# Patient Record
Sex: Male | Born: 1990 | Race: White | Hispanic: No | Marital: Single | Smoking: Former smoker
Health system: Southern US, Community
[De-identification: ages and names within clinical notes are randomized; demographics above are authoritative.]

## PROBLEM LIST (undated history)

## (undated) DIAGNOSIS — F1910 Other psychoactive substance abuse, uncomplicated: Secondary | ICD-10-CM

## (undated) HISTORY — PX: SPLENECTOMY, TOTAL: SHX788

---
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
## 2011-07-24 ENCOUNTER — Emergency Department (HOSPITAL_COMMUNITY): Payer: No Typology Code available for payment source

## 2011-07-24 ENCOUNTER — Encounter (HOSPITAL_COMMUNITY): Payer: Self-pay | Admitting: Emergency Medicine

## 2011-07-24 ENCOUNTER — Emergency Department (HOSPITAL_COMMUNITY)
Admission: EM | Admit: 2011-07-24 | Discharge: 2011-07-24 | Disposition: A | Discharge status: Home / Self Care | Payer: No Typology Code available for payment source | Attending: Emergency Medicine | Admitting: Emergency Medicine

## 2011-07-24 DIAGNOSIS — Y92410 Unspecified street and highway as the place of occurrence of the external cause: Secondary | ICD-10-CM | POA: Insufficient documentation

## 2011-07-24 DIAGNOSIS — S46919A Strain of unspecified muscle, fascia and tendon at shoulder and upper arm level, unspecified arm, initial encounter: Secondary | ICD-10-CM

## 2011-07-24 DIAGNOSIS — IMO0002 Reserved for concepts with insufficient information to code with codable children: Secondary | ICD-10-CM | POA: Insufficient documentation

## 2011-07-24 MED ORDER — OXYCODONE-ACETAMINOPHEN 5-325 MG PO TABS
1.0000 | ORAL_TABLET | Freq: Once | ORAL | Status: AC
  Administered 2011-07-24: 1 via ORAL
  Filled 2011-07-24: qty 1

## 2011-07-24 MED ORDER — OXYCODONE-ACETAMINOPHEN 5-325 MG PO TABS: 1.0000 | ORAL_TABLET | ORAL | Status: AC | PRN
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
## 2013-04-27 ENCOUNTER — Telehealth: Payer: Self-pay | Admitting: Physician Assistant

## 2013-04-27 NOTE — Telephone Encounter (Signed)
appt 4/28 with [facility]

## 2013-04-28 ENCOUNTER — Encounter (INDEPENDENT_AMBULATORY_CARE_PROVIDER_SITE_OTHER): Payer: Self-pay

## 2013-04-28 ENCOUNTER — Ambulatory Visit (INDEPENDENT_AMBULATORY_CARE_PROVIDER_SITE_OTHER): Payer: 59 | Admitting: Family Medicine

## 2013-04-28 ENCOUNTER — Encounter: Payer: Self-pay | Admitting: Family Medicine

## 2013-04-28 VITALS — BP 118/65 | HR 87 | Temp 99.1°F | Ht 71.0 in | Wt 187.2 lb

## 2013-04-28 DIAGNOSIS — L700 Acne vulgaris: Secondary | ICD-10-CM

## 2013-04-28 DIAGNOSIS — L708 Other acne: Secondary | ICD-10-CM

## 2013-04-28 MED ORDER — CLINDAMYCIN PHOS-BENZOYL PEROX 1-5 % EX GEL: Freq: Two times a day (BID) | CUTANEOUS | Status: DC

## 2013-04-28 MED ORDER — DOXYCYCLINE HYCLATE 100 MG PO TABS: 100.0000 mg | ORAL_TABLET | Freq: Two times a day (BID) | ORAL | Status: DC
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
## 2013-05-07 ENCOUNTER — Telehealth: Payer: Self-pay | Admitting: Family Medicine

## 2013-05-07 DIAGNOSIS — L709 Acne, unspecified: Secondary | ICD-10-CM

## 2013-05-08 NOTE — Telephone Encounter (Signed)
Referral placed.

## 2013-05-11 ENCOUNTER — Telehealth: Payer: Self-pay | Admitting: Family Medicine

## 2013-08-13 ENCOUNTER — Ambulatory Visit (INDEPENDENT_AMBULATORY_CARE_PROVIDER_SITE_OTHER): Payer: 59 | Admitting: Family Medicine

## 2013-08-13 VITALS — BP 142/89 | HR 109 | Temp 97.0°F | Ht 71.0 in | Wt 184.4 lb

## 2013-08-13 DIAGNOSIS — S61409A Unspecified open wound of unspecified hand, initial encounter: Secondary | ICD-10-CM

## 2013-08-13 DIAGNOSIS — Z23 Encounter for immunization: Secondary | ICD-10-CM

## 2013-08-13 DIAGNOSIS — S61411A Laceration without foreign body of right hand, initial encounter: Secondary | ICD-10-CM

## 2013-08-13 MED ORDER — AMOXICILLIN 875 MG PO TABS
875.0000 mg | ORAL_TABLET | Freq: Two times a day (BID) | ORAL | Status: DC
Start: 2013-08-13 — End: 2014-10-11
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
## 2013-08-21 ENCOUNTER — Ambulatory Visit (INDEPENDENT_AMBULATORY_CARE_PROVIDER_SITE_OTHER): Payer: 59 | Admitting: Family

## 2013-08-21 ENCOUNTER — Encounter: Payer: Self-pay | Admitting: Family

## 2013-08-21 ENCOUNTER — Telehealth: Payer: Self-pay | Admitting: *Deleted

## 2013-08-21 VITALS — BP 123/72 | HR 97 | Temp 98.7°F

## 2013-08-21 DIAGNOSIS — Z4802 Encounter for removal of sutures: Secondary | ICD-10-CM

## 2013-08-21 NOTE — Patient Instructions (Signed)

## 2013-08-21 NOTE — Telephone Encounter (Signed)
Please come earlier for appt.

## 2013-08-21 NOTE — Progress Notes (Signed)
   Subjective:    Patient ID: Shane Carr, male    DOB: 08-07-1990, 23 y.o.   MRN: [id number]  Pt presents to office to have 9 stitches removed in right wrist. Suture / Staple Removal The sutures were placed 7 to 10 days ago (Last Thursday). He tried antibiotic ointment use and oral antibiotics since the wound repair. The treatment provided mild relief. His temperature was unmeasured prior to arrival. There has been no drainage from the wound. There is no redness present. There is no swelling present. The pain has improved.       Review of Systems  Constitutional: Negative.   HENT: Negative.   Respiratory: Negative.   Cardiovascular: Negative.   Gastrointestinal: Negative.   Endocrine: Negative.   Genitourinary: Negative.   Musculoskeletal: Negative.   Neurological: Negative.   Hematological: Negative.   Psychiatric/Behavioral: Negative.   All other systems reviewed and are negative.      Objective:   Physical Exam  Vitals reviewed. Constitutional: He is oriented to person, place, and time. He appears well-developed and well-nourished. No distress.  Cardiovascular: Normal rate, regular rhythm, normal heart sounds and intact distal pulses.   No murmur heard. Pulmonary/Chest: Effort normal and breath sounds normal. No respiratory distress. He has no wheezes.  Abdominal: Soft. Bowel sounds are normal. He exhibits no distension. There is no tenderness.  Musculoskeletal: Normal range of motion. He exhibits no edema and no tenderness.  Neurological: He is alert and oriented to person, place, and time. He has normal reflexes. No cranial nerve deficit.  Skin: Skin is warm and dry. No rash noted. No erythema.  9 Stitches in right wrist  Psychiatric: He has a normal mood and affect. His behavior is normal. Judgment and thought content normal.    BP 123/72  Pulse 97  Temp(Src) 98.7 F (37.1 C) (Oral)  9 stitches removed from right wrist with no complication. Site looks great  with no redness or swelling.      Assessment & Plan:  1. Visit for suture removal -Discussed s/s of infection -Finish antibiotics  -Keep clean and dry  Jannifer Rodneyhristy Shermon Bozzi, FNP

## 2014-10-11 ENCOUNTER — Telehealth: Payer: Self-pay | Admitting: Family Medicine

## 2014-10-11 ENCOUNTER — Ambulatory Visit (INDEPENDENT_AMBULATORY_CARE_PROVIDER_SITE_OTHER): Payer: BLUE CROSS/BLUE SHIELD | Admitting: Family Medicine

## 2014-10-11 ENCOUNTER — Encounter: Payer: Self-pay | Admitting: Family Medicine

## 2014-10-11 VITALS — BP 115/78 | HR 96 | Temp 99.1°F | Ht 71.0 in | Wt 180.8 lb

## 2014-10-11 DIAGNOSIS — J0110 Acute frontal sinusitis, unspecified: Secondary | ICD-10-CM | POA: Diagnosis not present

## 2014-10-11 DIAGNOSIS — H6091 Unspecified otitis externa, right ear: Secondary | ICD-10-CM

## 2014-10-11 DIAGNOSIS — H6123 Impacted cerumen, bilateral: Secondary | ICD-10-CM | POA: Diagnosis not present

## 2014-10-11 MED ORDER — FLUTICASONE PROPIONATE 50 MCG/ACT NA SUSP: 2.0000 | Freq: Every day | NASAL | Status: DC

## 2014-10-11 MED ORDER — OFLOXACIN 0.3 % OT SOLN: 5.0000 [drp] | Freq: Every day | OTIC | Status: DC

## 2014-10-11 NOTE — Progress Notes (Signed)
BP 115/78 mmHg  Pulse 96  Temp(Src) 99.1 F (37.3 C) (Oral)  Ht  (1.803 m)  Wt 180 lb 12.8 oz (82.01 kg)  BMI 25.23 kg/m2   Subjective:    Patient ID: Shane Carr, male    DOB: [birth year], 24 y.o.   MRN: [id number]  HPI: Shane Carr is a 24 y.o. male presenting on 10/11/2014 for Ear Pain; Sinusitis; Chest congestion; and Cough   HPI Ear pain Patient has a 2 day history of bilateral ear pain and congestion and nasal congestion and sinus pressure and sore throat and cough. Cough is productive of clear to white sputum. He has had some low-grade temperatures in the 99 range but no true fevers. He denies any chills. He has been using Q-tips and feels like it made his ear is worse. He also smokes about a third pack per day. No sick contacts.  Relevant past medical, surgical, family and social history reviewed and updated as indicated. Interim medical history since our last visit reviewed. Allergies and medications reviewed and updated.  Review of Systems  Constitutional: Negative for fever and chills.  HENT: Positive for congestion, ear pain, postnasal drip, rhinorrhea, sinus pressure and sore throat. Negative for ear discharge and sneezing.   Eyes: Negative for discharge and visual disturbance.  Respiratory: Positive for cough. Negative for chest tightness, shortness of breath and wheezing.   Cardiovascular: Negative for chest pain, palpitations and leg swelling.  Gastrointestinal: Negative for abdominal pain, diarrhea and constipation.  Genitourinary: Negative for difficulty urinating.  Musculoskeletal: Negative for back pain and gait problem.  Skin: Negative for rash.  Neurological: Negative for dizziness, syncope, light-headedness and headaches.  All other systems reviewed and are negative.   Per HPI unless specifically indicated above     Medication List       This list is accurate as of: 10/11/14  2:35 PM.  Always use your most recent med list.                 fluticasone 50 MCG/ACT nasal spray  Commonly known as:  FLONASE  Place 2 sprays into both nostrils daily.           Objective:    BP 115/78 mmHg  Pulse 96  Temp(Src) 99.1 F (37.3 C) (Oral)  Ht  (1.803 m)  Wt 180 lb 12.8 oz (82.01 kg)  BMI 25.23 kg/m2  Wt Readings from Last 3 Encounters:  10/11/14 180 lb 12.8 oz (82.01 kg)  08/13/13 184 lb 6.4 oz (83.643 kg)  04/28/13 187 lb 3.2 oz (84.913 kg)    Physical Exam  Constitutional: He is oriented to person, place, and time. He appears well-developed and well-nourished. No distress.  HENT:  Right Ear: Tympanic membrane and ear canal normal. There is drainage (cerumen impaction), swelling and tenderness. No mastoid tenderness. Tympanic membrane is not injected, not scarred, not perforated, not erythematous and not bulging. No middle ear effusion.  Left Ear: Tympanic membrane and ear canal normal. There is drainage (Cerumen impaction). No swelling. No mastoid tenderness. Tympanic membrane is not injected, not scarred, not perforated, not erythematous and not bulging.  No middle ear effusion.  Nose: Mucosal edema and rhinorrhea present. No sinus tenderness. No epistaxis. Right sinus exhibits maxillary sinus tenderness. Right sinus exhibits no frontal sinus tenderness. Left sinus exhibits maxillary sinus tenderness. Left sinus exhibits no frontal sinus tenderness.  Mouth/Throat: Uvula is midline and mucous membranes are normal. Posterior oropharyngeal edema and posterior oropharyngeal erythema present. No  oropharyngeal exudate or tonsillar abscesses.  Eyes: Conjunctivae and EOM are normal. Pupils are equal, round, and reactive to light. Right eye exhibits no discharge. No scleral icterus.  Cardiovascular: Normal rate, regular rhythm, normal heart sounds and intact distal pulses.   No murmur heard. Pulmonary/Chest: Effort normal and breath sounds normal. No respiratory distress. He has no wheezes.  Musculoskeletal: Normal range  of motion. He exhibits no edema.  Neurological: He is alert and oriented to person, place, and time. Coordination normal.  Skin: Skin is warm and dry. No rash noted. He is not diaphoretic.  Psychiatric: He has a normal mood and affect. His behavior is normal.  Vitals reviewed.  Cerumen disimpaction: There is able to completely disimpact sermon with washing. Patient tolerated well with no major side effects.  No results found for this or any previous visit.    Assessment & Plan:   Problem List Items Addressed This Visit    None    Visit Diagnoses    Cerumen impaction, bilateral    -  Primary    Resolved after washing    Acute frontal sinusitis, recurrence not specified        Relevant Medications    fluticasone (FLONASE) 50 MCG/ACT nasal spray    Right otitis externa        Relevant Medications    ofloxacin (FLOXIN OTIC) 0.3 % otic solution        Follow up plan: Return in about 1 year (around 10/11/2015), or if symptoms worsen or fail to improve.  Arville Care, MD [facility] 10/11/2014, 2:35 PM

## 2014-10-11 NOTE — Telephone Encounter (Signed)
Spoke to pharmacist and advised ok to use eye drops in ears per pharmacist Henrene Pastor and Dr.Dettinger verbally ok'd.

## 2014-10-15 ENCOUNTER — Telehealth: Payer: Self-pay | Admitting: Family Medicine

## 2014-10-15 MED ORDER — AZITHROMYCIN 250 MG PO TABS: ORAL_TABLET | ORAL | Status: DC

## 2014-10-15 NOTE — Telephone Encounter (Signed)
Pt aware rx sent to pharmacy.

## 2014-10-15 NOTE — Telephone Encounter (Signed)
Okay to send azithromycin 250 mg, take 2 the first day and then one every day after total of 6 pills for 5 days.

## 2014-12-07 ENCOUNTER — Ambulatory Visit (INDEPENDENT_AMBULATORY_CARE_PROVIDER_SITE_OTHER): Payer: BLUE CROSS/BLUE SHIELD | Admitting: Family

## 2014-12-07 VITALS — BP 133/78 | HR 97 | Temp 97.5°F | Ht 71.0 in

## 2014-12-07 DIAGNOSIS — R5383 Other fatigue: Secondary | ICD-10-CM | POA: Diagnosis not present

## 2014-12-07 DIAGNOSIS — R0789 Other chest pain: Secondary | ICD-10-CM

## 2014-12-07 DIAGNOSIS — F1920 Other psychoactive substance dependence, uncomplicated: Secondary | ICD-10-CM

## 2014-12-07 NOTE — Patient Instructions (Signed)
Chemical Dependency Chemical dependency is an addiction to drugs or alcohol. It is characterized by the repeated behavior of seeking out and using drugs and alcohol despite harmful consequences to the health and safety of ones self and others.  RISK FACTORS There are certain situations or behaviors that increase a person's risk for chemical dependency. These include:  A family history of chemical dependency.  A history of mental health issues, including depression and anxiety.  A home environment where drugs and alcohol are easily available to you.  Drug or alcohol use at a young age. SYMPTOMS  The following symptoms can indicate chemical dependency:  Inability to limit the use of drugs or alcohol.  Nausea, sweating, shakiness, and anxiety that occurs when alcohol or drugs are not being used.  An increase in amount of drugs or alcohol that is necessary to get drunk or high. People who experience these symptoms can assess their use of drugs and alcohol by asking themselves the following questions:  Have you been told by friends or family that they are worried about your use of alcohol or drugs?  Do friends and family ever tell you about things you did while drinking alcohol or using drugs that you do not remember?  Do you lie about using alcohol or drugs or about the amounts you use?  Do you have difficulty completing daily tasks unless you use alcohol or drugs?  Is the level of your work or school performance lower because of your drug or alcohol use?  Do you get sick from using drugs or alcohol but keep using anyway?  Do you feel uncomfortable in social situations unless you use alcohol or drugs?  Do you use drugs or alcohol to help forget problems? An answer of yes to any of these questions may indicate chemical dependency. Professional evaluation is suggested.   This information is not intended to replace advice given to you by your health care provider. Make sure you  discuss any questions you have with your health care provider.   Document Released: 12/12/2000 Document Revised: 03/12/2011 Document Reviewed: 02/23/2010 Elsevier Interactive Patient Education 2016 [employer]. Polysubstance Abuse When people abuse more than one drug or type of drug it is called polysubstance or polydrug abuse. For example, many smokers also drink alcohol. This is one form of polydrug abuse. Polydrug abuse also refers to the use of a drug to counteract an unpleasant effect produced by another drug. It may also be used to help with withdrawal from another drug. People who take stimulants may become agitated. Sometimes this agitation is countered with a tranquilizer. This helps protect against the unpleasant side effects. Polydrug abuse also refers to the use of different drugs at the same time.  Anytime drug use is interfering with normal living activities, it has become abuse. This includes problems with family and friends. Psychological dependence has developed when your mind tells you that the drug is needed. This is usually followed by physical dependence which has developed when continuing increases of drug are required to get the same feeling or "high". This is known as addiction or chemical dependency. A person's risk is much higher if there is a history of chemical dependency in the family. SIGNS OF CHEMICAL DEPENDENCY  You have been told by friends or family that drugs have become a problem.  You fight when using drugs.  You are having blackouts (not remembering what you do while using).  You feel sick from using drugs but continue using.  You  lie about use or amounts of drugs (chemicals) used.  You need chemicals to get you going.  You are suffering in work performance or in school because of drug use.  You get sick from use of drugs but continue to use anyway.  You need drugs to relate to people or feel comfortable in social situations.  You use drugs to forget  problems. "Yes" answered to any of the above signs of chemical dependency indicates there are problems. The longer the use of drugs continues, the greater the problems will become. If there is a family history of drug or alcohol use, it is best not to experiment with these drugs. Continual use leads to tolerance. After tolerance develops more of the drug is needed to get the same feeling. This is followed by addiction. With addiction, drugs become the most important part of life. It becomes more important to take drugs than participate in the other usual activities of life. This includes relating to friends and family. Addiction is followed by dependency. Dependency is a condition where drugs are now needed not just to get high, but to feel normal. Addiction cannot be cured but it can be stopped. This often requires outside help and the care of professionals. Treatment centers are listed in the yellow pages under: Cocaine, Narcotics, and Alcoholics Anonymous. Most hospitals and clinics can refer you to a specialized care center. Talk to your caregiver if you need help.   This information is not intended to replace advice given to you by your health care provider. Make sure you discuss any questions you have with your health care provider.   Document Released: 08/09/2004 Document Revised: 03/12/2011 Document Reviewed: 12/23/2013 Elsevier Interactive Patient Education 2016 [employer] Nonspecific Chest Pain  Chest pain can be caused by many different conditions. There is always a chance that your pain could be related to something serious, such as a heart attack or a blood clot in your lungs. Chest pain can also be caused by conditions that are not life-threatening. If you have chest pain, it is very important to follow up with your health care provider. CAUSES  Chest pain can be caused by:  Heartburn.  Pneumonia or bronchitis.  Anxiety or stress.  Inflammation around your heart (pericarditis)  or lung (pleuritis or pleurisy).  A blood clot in your lung.  A collapsed lung (pneumothorax). It can develop suddenly on its own (spontaneous pneumothorax) or from trauma to the chest.  Shingles infection (varicella-zoster virus).  Heart attack.  Damage to the bones, muscles, and cartilage that make up your chest wall. This can include:  Bruised bones due to injury.  Strained muscles or cartilage due to frequent or repeated coughing or overwork.  Fracture to one or more ribs.  Sore cartilage due to inflammation (costochondritis). RISK FACTORS  Risk factors for chest pain may include:  Activities that increase your risk for trauma or injury to your chest.  Respiratory infections or conditions that cause frequent coughing.  Medical conditions or overeating that can cause heartburn.  Heart disease or family history of heart disease.  Conditions or health behaviors that increase your risk of developing a blood clot.  Having had chicken pox (varicella zoster). SIGNS AND SYMPTOMS Chest pain can feel like:  Burning or tingling on the surface of your chest or deep in your chest.  Crushing, pressure, aching, or squeezing pain.  Dull or sharp pain that is worse when you move, cough, or take a deep breath.  Pain that  is also felt in your back, neck, shoulder, or arm, or pain that spreads to any of these areas. Your chest pain may come and go, or it may stay constant. DIAGNOSIS Lab tests or other studies may be needed to find the cause of your pain. Your health care provider may have you take a test called an ambulatory ECG (electrocardiogram). An ECG records your heartbeat patterns at the time the test is performed. You may also have other tests, such as:  Transthoracic echocardiogram (TTE). During echocardiography, sound waves are used to create a picture of all of the heart structures and to look at how blood flows through your heart.  Transesophageal echocardiogram  (TEE).This is a more advanced imaging test that obtains images from inside your body. It allows your health care provider to see your heart in finer detail.  Cardiac monitoring. This allows your health care provider to monitor your heart rate and rhythm in real time.  Holter monitor. This is a portable device that records your heartbeat and can help to diagnose abnormal heartbeats. It allows your health care provider to track your heart activity for several days, if needed.  Stress tests. These can be done through exercise or by taking medicine that makes your heart beat more quickly.  Blood tests.  Imaging tests. TREATMENT  Your treatment depends on what is causing your chest pain. Treatment may include:  Medicines. These may include:  Acid blockers for heartburn.  Anti-inflammatory medicine.  Pain medicine for inflammatory conditions.  Antibiotic medicine, if an infection is present.  Medicines to dissolve blood clots.  Medicines to treat coronary artery disease.  Supportive care for conditions that do not require medicines. This may include:  Resting.  Applying heat or cold packs to injured areas.  Limiting activities until pain decreases. HOME CARE INSTRUCTIONS  If you were prescribed an antibiotic medicine, finish it all even if you start to feel better.  Avoid any activities that bring on chest pain.  Do not use any tobacco products, including cigarettes, chewing tobacco, or electronic cigarettes. If you need help quitting, ask your health care provider.  Do not drink alcohol.  Take medicines only as directed by your health care provider.  Keep all follow-up visits as directed by your health care provider. This is important. This includes any further testing if your chest pain does not go away.  If heartburn is the cause for your chest pain, you may be told to keep your head raised (elevated) while sleeping. This reduces the chance that acid will go from your  stomach into your esophagus.  Make lifestyle changes as directed by your health care provider. These may include:  Getting regular exercise. Ask your health care provider to suggest some activities that are safe for you.  Eating a heart-healthy diet. A registered dietitian can help you to learn healthy eating options.  Maintaining a healthy weight.  Managing diabetes, if necessary.  Reducing stress. SEEK MEDICAL CARE IF:  Your chest pain does not go away after treatment.  You have a rash with blisters on your chest.  You have a fever. SEEK IMMEDIATE MEDICAL CARE IF:   Your chest pain is worse.  You have an increasing cough, or you cough up blood.  You have severe abdominal pain.  You have severe weakness.  You faint.  You have chills.  You have sudden, unexplained chest discomfort.  You have sudden, unexplained discomfort in your arms, back, neck, or jaw.  You have shortness of  breath at any time.  You suddenly start to sweat, or your skin gets clammy.  You feel nauseous or you vomit.  You suddenly feel light-headed or dizzy.  Your heart begins to beat quickly, or it feels like it is skipping beats. These symptoms may represent a serious problem that is an emergency. Do not wait to see if the symptoms will go away. Get medical help right away. Call your local emergency services (911 in the [place] Do not drive yourself to the hospital.   This information is not intended to replace advice given to you by your health care provider. Make sure you discuss any questions you have with your health care provider.   Document Released: 09/27/2004 Document Revised: 01/08/2014 Document Reviewed: 07/24/2013 Elsevier Interactive Patient Education [employer]

## 2014-12-07 NOTE — Progress Notes (Signed)
   Subjective:    Patient ID: Shane Carr, male    DOB: Feb 18, 1990, 24 y.o.   MRN: [id number]  HPI PT presents to the office today with chest pain, SOB, and lethargy. Mother is presents and states patient this week "went on a binge of drugs" that included meth and prescription pills. Pt states he took "about 10 percocet  on Friday", but denies any other drugs.     Review of Systems  Constitutional: Negative.   HENT: Negative.   Respiratory: Negative.   Cardiovascular: Negative.   Gastrointestinal: Negative.   Endocrine: Negative.   Genitourinary: Negative.   Musculoskeletal: Negative.   Neurological: Negative.   Hematological: Negative.   Psychiatric/Behavioral: Negative.   All other systems reviewed and are negative.      Objective:   Physical Exam  Constitutional: He is oriented to person, place, and time. He appears well-developed and well-nourished. No distress.  HENT:  Head: Normocephalic.  Mouth/Throat: Oropharynx is clear and moist.  Eyes: Pupils are equal, round, and reactive to light. Right eye exhibits no discharge. Left eye exhibits no discharge.  Dilated pupils   Neck: Normal range of motion. Neck supple. No thyromegaly present.  Cardiovascular: Normal rate, regular rhythm, normal heart sounds and intact distal pulses.   No murmur heard. Pulmonary/Chest: Effort normal and breath sounds normal. No respiratory distress. He has no wheezes.  Abdominal: Soft. Bowel sounds are normal. He exhibits no distension. There is no tenderness.  Musculoskeletal: Normal range of motion. He exhibits no edema or tenderness.  Neurological: He is alert and oriented to person, place, and time. He has normal reflexes. No cranial nerve deficit.  Skin: Skin is warm and dry. No rash noted. No erythema.  Psychiatric: He has a normal mood and affect. Judgment and thought content normal. His speech is delayed. He is hyperactive.  Lethargic   Vitals reviewed.   BP 133/78 mmHg  Pulse  97  Temp(Src) 97.5 F (36.4 C) (Oral)  Ht 5\' 11"  (1.803 m)  SpO2 97%       Assessment & Plan:  1. Other chest pain - EKG 12-Lead  2. Lethargy  3. Drug abuse and dependence (HCC)  Vital signs stable at this time PT told to go to ED- I believe he is having withdrawal symptoms Long discussion about getting help and rehab RTO prn  Jannifer Rodneyhristy Yer Olivencia, FNP

## 2014-12-19 DIAGNOSIS — J690 Pneumonitis due to inhalation of food and vomit: Secondary | ICD-10-CM | POA: Insufficient documentation

## 2014-12-19 DIAGNOSIS — F1120 Opioid dependence, uncomplicated: Secondary | ICD-10-CM | POA: Insufficient documentation

## 2014-12-19 DIAGNOSIS — I5A Non-ischemic myocardial injury (non-traumatic): Secondary | ICD-10-CM | POA: Insufficient documentation

## 2014-12-19 DIAGNOSIS — F1910 Other psychoactive substance abuse, uncomplicated: Secondary | ICD-10-CM | POA: Insufficient documentation

## 2014-12-31 ENCOUNTER — Encounter: Payer: Self-pay | Admitting: Family

## 2014-12-31 ENCOUNTER — Ambulatory Visit (INDEPENDENT_AMBULATORY_CARE_PROVIDER_SITE_OTHER): Payer: BLUE CROSS/BLUE SHIELD | Admitting: Family

## 2014-12-31 VITALS — BP 133/76 | HR 105 | Temp 97.3°F | Ht 71.0 in | Wt 177.2 lb

## 2014-12-31 DIAGNOSIS — Z09 Encounter for follow-up examination after completed treatment for conditions other than malignant neoplasm: Secondary | ICD-10-CM | POA: Diagnosis not present

## 2014-12-31 DIAGNOSIS — Z7151 Drug abuse counseling and surveillance of drug abuser: Secondary | ICD-10-CM | POA: Diagnosis not present

## 2014-12-31 DIAGNOSIS — F411 Generalized anxiety disorder: Secondary | ICD-10-CM | POA: Diagnosis not present

## 2014-12-31 NOTE — Progress Notes (Signed)
   Subjective:    Patient ID: Shane Carr, male    DOB: 09-Jan-1990, 24 y.o.   MRN: [id number]   HPI Pt presents to the office today for hospital follow up from [facility]. PT was admitted on 12/15/14 with altered mental status and was unresponsive related to drug overdose. Pt was also diagnosed with CAP and has completed his antibiotics for this. Mother is present during visit today and states pt was found unresponsive and CPR was started and patient was flew to [facility]. Pt was discharged on 12/19/14. PT is enrolled at [facility]. PT states he has been drug free since being released from the Hospital. PT states he continues to smoke 4-5 cigarettes a day. Pt denies any alcohol use at this time.   [facility] notes were reviewed.   Review of Systems  Constitutional: Negative.   HENT: Negative.   Respiratory: Negative.   Cardiovascular: Negative.   Gastrointestinal: Negative.   Endocrine: Negative.   Genitourinary: Negative.   Musculoskeletal: Negative.   Neurological: Negative.   Hematological: Negative.   Psychiatric/Behavioral: Negative.   All other systems reviewed and are negative.      Objective:   Physical Exam  Constitutional: He is oriented to person, place, and time. He appears well-developed and well-nourished. No distress.  HENT:  Head: Normocephalic.  Right Ear: External ear normal.  Left Ear: External ear normal.  Mouth/Throat: Oropharynx is clear and moist.  Eyes: Pupils are equal, round, and reactive to light. Right eye exhibits no discharge. Left eye exhibits no discharge.  Neck: Normal range of motion. Neck supple. No thyromegaly present.  Cardiovascular: Normal rate, regular rhythm, normal heart sounds and intact distal pulses.   No murmur heard. Pulmonary/Chest: Effort normal and breath sounds normal. No respiratory distress. He has no wheezes.  Abdominal: Soft. Bowel sounds are normal. He exhibits  no distension. There is no tenderness.  Musculoskeletal: Normal range of motion. He exhibits no edema or tenderness.  Neurological: He is alert and oriented to person, place, and time. He has normal reflexes. No cranial nerve deficit.  Skin: Skin is warm and dry. No rash noted. No erythema.  Psychiatric: He has a normal mood and affect. His behavior is normal. Judgment and thought content normal.  Vitals reviewed.     BP 133/76 mmHg  Pulse 105  Temp(Src) 97.3 F (36.3 C) (Oral)  Ht _0  (1.803 m)  Wt 177 lb 3.2 oz (80.377 kg)  BMI 24.73 kg/m2     Assessment & Plan:  1. Hospital discharge follow-up - CMP14+EGFR  2. GAD (generalized anxiety disorder) --List is Behavioral Health location to patient to call and make appt -Stress management discussed - CMP14+EGFR  3. Drug abuse counseling and surveillance of drug abuser -Continue with Rehab program and keep all follow up - CMP14+EGFR   Continue all meds Labs pending Health Maintenance reviewed Diet and exercise encouraged RTO 3 months  Evelina Dun, FNP

## 2014-12-31 NOTE — Patient Instructions (Signed)
Drug Overdose Drug overdose happens when you take too much of a drug. An overdose can occur with illegal drugs, prescription drugs, or over-the-counter (OTC) drugs. The effects of drug overdose can be mild, dangerous, or even deadly. CAUSES Drug overdose may be caused by:  Taking too much of a drug on purpose.  Taking too much of a drug by accident.  An error made by a health care provider who prescribes a drug.  An error made by a pharmacist who fills the prescription order. Drugs that commonly cause overdose include:  Mental health drugs.  Pain medicines.  Illegal drugs.  OTC cough and cold medicines.  Heart medicines.  Seizure medicines. RISK FACTORS Drug overdose is more likely in:  Children. They may be attracted to colorful pills. Because of children's small size, even a small amount of a drug can be dangerous.  Elderly people. They may be taking many different drugs. Elderly people may have difficulty reading labels or remembering when they last took their medicine. The risk of drug overdose is also higher for someone who:  Takes illegal drugs.  Takes a drug and drinks alcohol.  Has a mental health condition. SYMPTOMS Signs and symptoms of drug overdose depend on the drug and the amount that was taken. Common danger signs include:  Behavior changes.  Sleepiness.  Slowed breathing.  Nausea and vomiting.  Seizures.  Changes in eye pupil size (very large or very small). If there are signs of very low blood pressure from a drug overdose (shock), emergency treatment is required. These signs include:  Cold and clammy skin.  Pale skin.  Blue lips.  Very slow breathing.  Extreme sleepiness.  Loss of consciousness. DIAGNOSIS Drug overdose may be diagnosed based on your symptoms. It is important that you tell your health care provider:  All of the drugs that you have taken.  When you took the drugs.  Whether you were drinking alcohol. Your health  care provider will do a physical exam. This exam may include:  Checking and monitoring your heart rate and rhythm, your temperature, and your blood pressure (vital signs).  Checking your breathing and oxygen level. You may also have tests, including:   Urine tests to check for drugs in your system.  Blood tests to check for:  Drugs in your system.  Signs of an imbalance of your blood minerals (electrolytes).  Liver damage.  Kidney damage. TREATMENT Supporting your vital signs and your breathing is the first step in treating a drug overdose. Treatment may also include:  Receiving fluids and electrolytes through an IV tube.  Having a breathing tube (endotracheal tube) inserted in your airway to help you breathe.  Having a tube passed through your nose and into your stomach (nasogastric tube) to wash out your stomach.  Medicines. You may get medicines to:  Make you vomit.  Absorb any medicine that is left in your digestive system (activated charcoal).  Block or reverse the effect of the drug that caused the overdose.  Having your blood filtered through an artificial kidney machine (hemodialysis). You may need this if your overdose is severe or if you have kidney failure.  Having ongoing counseling and mental health support if you intentionally overdosed or used an illegal drug. HOME CARE INSTRUCTIONS  Take medicines only as directed by your health care provider. Always ask your health care provider to discuss the possible side effects of any new drug that you start taking.  Keep a list of all of the drugs  that you take, including over-the-counter medicines. Bring this list with you to all of your medical visits.  Read the drug inserts that come with your medicines.  Do not use illegal drugs.  Do not drink alcohol when taking drugs.  Store all medicines in safety containers that are out of the reach of children.  Keep the phone number of your local poison control  center near your phone or on your cell phone.  Get help if you are struggling with alcohol or drug use.  Get help if you are struggling with depression or another mental health problem.  Keep all follow-up visits as directed by your health care provider. This is important. SEEK MEDICAL CARE IF:  Your symptoms return.  You develop any new signs or symptoms when you are taking medicines. SEEK IMMEDIATE MEDICAL CARE IF:  You think that you or someone else may have taken too much of a drug. The hotline of the [facility] is [phone].  You or someone else is having symptoms of a drug overdose.  You have serious thoughts about hurting yourself or others.  You have chest pain.  You have difficulty breathing.  You have a loss of consciousness. Drug overdose is an emergency. Do not wait to see if the symptoms will go away. Get medical help right away. Call your local emergency services (911 in the [place] Do not drive yourself to the hospital.   This information is not intended to replace advice given to you by your health care provider. Make sure you discuss any questions you have with your health care provider.   Document Released: 05/04/2014 Document Reviewed: 05/04/2014 Elsevier Interactive Patient Education 2016 Elsevier Inc. Generalized Anxiety Disorder Generalized anxiety disorder (GAD) is a mental disorder. It interferes with life functions, including relationships, work, and school. GAD is different from normal anxiety, which everyone experiences at some point in their lives in response to specific life events and activities. Normal anxiety actually helps [place] prepare for and get through these life events and activities. Normal anxiety goes away after the event or activity is over.  GAD causes anxiety that is not necessarily related to specific events or activities. It also causes excess anxiety in proportion to specific events or activities. The anxiety  associated with GAD is also difficult to control. GAD can vary from mild to severe. People with severe GAD can have intense waves of anxiety with physical symptoms (panic attacks).  SYMPTOMS The anxiety and worry associated with GAD are difficult to control. This anxiety and worry are related to many life events and activities and also occur more days than not for 6 months or longer. People with GAD also have three or more of the following symptoms (one or more in children):  Restlessness.   Fatigue.  Difficulty concentrating.   Irritability.  Muscle tension.  Difficulty sleeping or unsatisfying sleep. DIAGNOSIS GAD is diagnosed through an assessment by your health care provider. Your health care provider will ask you questions aboutyour mood,physical symptoms, and events in your life. Your health care provider may ask you about your medical history and use of alcohol or drugs, including prescription medicines. Your health care provider may also do a physical exam and blood tests. Certain medical conditions and the use of certain substances can cause symptoms similar to those associated with GAD. Your health care provider may refer you to a mental health specialist for further evaluation. TREATMENT The following therapies are usually used to treat GAD:  Medication. Antidepressant medication usually is prescribed for long-term daily control. Antianxiety medicines may be added in severe cases, especially when panic attacks occur.   Talk therapy (psychotherapy). Certain types of talk therapy can be helpful in treating GAD by providing support, education, and guidance. A form of talk therapy called cognitive behavioral therapy can teach you healthy ways to think about and react to daily life events and activities.  Stress managementtechniques. These include yoga, meditation, and exercise and can be very helpful when they are practiced regularly. A mental health specialist can help  determine which treatment is best for you. Some people see improvement with one therapy. However, other people require a combination of therapies.   This information is not intended to replace advice given to you by your health care provider. Make sure you discuss any questions you have with your health care provider.   Document Released: 04/14/2012 Document Revised: 01/08/2014 Document Reviewed: 04/14/2012 Elsevier Interactive Patient Education [employer]2016 Elsevier Inc.

## 2015-01-15 ENCOUNTER — Emergency Department (HOSPITAL_COMMUNITY)
Admission: EM | Admit: 2015-01-15 | Discharge: 2015-01-16 | Discharge status: Against Medical Advice | Payer: BLUE CROSS/BLUE SHIELD | Attending: Emergency Medicine | Admitting: Emergency Medicine

## 2015-01-15 ENCOUNTER — Emergency Department (HOSPITAL_COMMUNITY): Payer: BLUE CROSS/BLUE SHIELD

## 2015-01-15 ENCOUNTER — Encounter (HOSPITAL_COMMUNITY): Payer: Self-pay | Admitting: *Deleted

## 2015-01-15 DIAGNOSIS — Z792 Long term (current) use of antibiotics: Secondary | ICD-10-CM | POA: Diagnosis not present

## 2015-01-15 DIAGNOSIS — F1310 Sedative, hypnotic or anxiolytic abuse, uncomplicated: Secondary | ICD-10-CM | POA: Insufficient documentation

## 2015-01-15 DIAGNOSIS — Z91040 Latex allergy status: Secondary | ICD-10-CM | POA: Diagnosis not present

## 2015-01-15 DIAGNOSIS — F1110 Opioid abuse, uncomplicated: Secondary | ICD-10-CM | POA: Diagnosis not present

## 2015-01-15 DIAGNOSIS — F17200 Nicotine dependence, unspecified, uncomplicated: Secondary | ICD-10-CM | POA: Diagnosis not present

## 2015-01-15 DIAGNOSIS — F1910 Other psychoactive substance abuse, uncomplicated: Secondary | ICD-10-CM

## 2015-01-15 DIAGNOSIS — Z8701 Personal history of pneumonia (recurrent): Secondary | ICD-10-CM | POA: Insufficient documentation

## 2015-01-15 DIAGNOSIS — R4182 Altered mental status, unspecified: Secondary | ICD-10-CM | POA: Diagnosis present

## 2015-01-15 HISTORY — DX: Other psychoactive substance abuse, uncomplicated: F19.10

## 2015-01-15 LAB — CBC WITH DIFFERENTIAL/PLATELET
BAND NEUTROPHILS: 0 %
BASOS PCT: 1 %
Basophils Absolute: 0.2 10*3/uL — ABNORMAL HIGH (ref 0.0–0.1)
Blasts: 0 %
EOS ABS: 0.7 10*3/uL (ref 0.0–0.7)
Eosinophils Relative: 4 %
HEMATOCRIT: 41.4 % (ref 39.0–52.0)
HEMOGLOBIN: 13.8 g/dL (ref 13.0–17.0)
LYMPHS ABS: 5.2 10*3/uL — AB (ref 0.7–4.0)
Lymphocytes Relative: 31 %
MCH: 30.9 pg (ref 26.0–34.0)
MCHC: 33.3 g/dL (ref 30.0–36.0)
MCV: 92.8 fL (ref 78.0–100.0)
MONO ABS: 1.2 10*3/uL — AB (ref 0.1–1.0)
MYELOCYTES: 0 %
Metamyelocytes Relative: 0 %
Monocytes Relative: 7 %
Neutro Abs: 9.5 10*3/uL — ABNORMAL HIGH (ref 1.7–7.7)
Neutrophils Relative %: 57 %
Other: 0 %
PROMYELOCYTES ABS: 0 %
Platelets: 412 10*3/uL — ABNORMAL HIGH (ref 150–400)
RBC: 4.46 MIL/uL (ref 4.22–5.81)
RDW: 13.7 % (ref 11.5–15.5)
WBC: 16.8 10*3/uL — AB (ref 4.0–10.5)
nRBC: 0 /100 WBC

## 2015-01-15 LAB — COMPREHENSIVE METABOLIC PANEL
ALBUMIN: 4.6 g/dL (ref 3.5–5.0)
ALT: 34 U/L (ref 17–63)
ANION GAP: 6 (ref 5–15)
AST: 23 U/L (ref 15–41)
Alkaline Phosphatase: 64 U/L (ref 38–126)
BUN: 16 mg/dL (ref 6–20)
CO2: 30 mmol/L (ref 22–32)
Calcium: 9.7 mg/dL (ref 8.9–10.3)
Chloride: 104 mmol/L (ref 101–111)
Creatinine, Ser: 1.2 mg/dL (ref 0.61–1.24)
GFR calc Af Amer: >60 mL/min (ref >60)
GFR calc non Af Amer: >60 mL/min (ref >60)
GLUCOSE: 103 mg/dL — AB (ref 65–99)
POTASSIUM: 4.3 mmol/L (ref 3.5–5.1)
SODIUM: 140 mmol/L (ref 135–145)
Total Bilirubin: 0.3 mg/dL (ref 0.3–1.2)
Total Protein: 7.5 g/dL (ref 6.5–8.1)

## 2015-01-15 LAB — SALICYLATE LEVEL: Salicylate Lvl: <4 mg/dL (ref 2.8–30.0)

## 2015-01-15 LAB — ETHANOL: Alcohol, Ethyl (B): <5 mg/dL (ref <5)

## 2015-01-15 LAB — ACETAMINOPHEN LEVEL: Acetaminophen (Tylenol), Serum: <10 ug/mL — ABNORMAL LOW (ref 10–30)

## 2015-01-15 NOTE — ED Notes (Addendum)
Pt to department via [facility].  Initial call for possible overdose.  Pt lethargic upon arrival but responsive to verbal stimulus.  Pt reports taking 2 vistaril and a xanax.  Per EMS, other substances on scene field tested positive for heroin but pt denies taking any.   Pt denies drug use or ETOH

## 2015-01-16 LAB — URINALYSIS, ROUTINE W REFLEX MICROSCOPIC
Bilirubin Urine: NEGATIVE
Glucose, UA: NEGATIVE mg/dL
HGB URINE DIPSTICK: NEGATIVE
Ketones, ur: NEGATIVE mg/dL
LEUKOCYTES UA: NEGATIVE
Nitrite: NEGATIVE
PROTEIN: NEGATIVE mg/dL
Specific Gravity, Urine: >1.03 — ABNORMAL HIGH (ref 1.005–1.030)
pH: 5 (ref 5.0–8.0)

## 2015-01-16 LAB — RAPID URINE DRUG SCREEN, HOSP PERFORMED
Amphetamines: NOT DETECTED
BARBITURATES: NOT DETECTED
BENZODIAZEPINES: POSITIVE — AB
Cocaine: NOT DETECTED
Opiates: POSITIVE — AB
Tetrahydrocannabinol: NOT DETECTED

## 2015-01-16 MED ORDER — SODIUM CHLORIDE 0.9 % IV BOLUS (SEPSIS): 1000.0000 mL | Freq: Once | INTRAVENOUS | Status: DC

## 2015-01-16 NOTE — ED Notes (Signed)
Patient given urinal for specimen but he put water in the urine cup.

## 2015-01-16 NOTE — ED Provider Notes (Signed)
CSN: [id number]     Arrival date & time 01/15/15  2159 History   First MD Initiated Contact with Patient 01/15/15 2305     Chief Complaint  Patient presents with  . Altered Mental Status     (Consider location/radiation/quality/duration/timing/severity/associated sxs/prior Treatment) HPI Pt [facility] ED [facility] for possible overdose. They report patient was lethargic, but arousable. Pt is sitting on the stretcher and as soon as I enter the room is demanding something more to drink. He admits to taking a couple of clonidine "to make me sleepy" and drank 3-4 beers. He states he lives with his GM. In the afternoon he called his parents and they report he was very agitated. About 5:30 PM his grandmother called them and said he seemed high and he was stumbling around and he passed out. That was when EMS was called. Parent states [facility] after an overdose complicated by aspiration pneumonia. He has been in a rehabilitation facility and just got out 36 hours ago. They report [facility] [place]. Patient does not admit to suicidal or homicidal ideation.  PCP Dr Lendon ColonelHawks  Past Medical History  Diagnosis Date  . Polysubstance abuse     opana, meth, xanax, heroin, adderal   Past Surgical History  Procedure Laterality Date  . Splenectomy, total     Family History  Problem Relation Age of Onset  . Heart disease Father   . Diabetes Father    Social History  Substance Use Topics  . Smoking status: Current Some Day Smoker  . Smokeless tobacco: None  . Alcohol Use: 0.6 oz/week    1 Standard drinks or equivalent per week  has been incarcerated  Review of Systems  All other systems reviewed and are negative.     Allergies  Latex  Home Medications   Prior to Admission medications   Medication Sig Start Date End Date Taking? Authorizing Provider  hydrOXYzine  (ATARAX/VISTARIL) 50 MG tablet Take 50 mg by mouth every 6 (six) hours as needed for anxiety or itching.   Yes Historical Provider, MD  cloNIDine (CATAPRES) 0.2 MG tablet Take 0.2 mg by mouth 3 (three) times daily as needed.  12/27/14   Historical Provider, MD  dicyclomine (BENTYL) 10 MG capsule Take 10 mg by mouth 4 (four) times daily as needed for spasms.  12/19/14   Historical Provider, MD  moxifloxacin (AVELOX) 400 MG tablet Take 400 mg by mouth daily.  12/27/14   Historical Provider, MD   BP 138/69 mmHg  Pulse 113  Temp(Src) 99.8 F (37.7 C) (Oral)  Resp 20  Ht 6' (1.829 m)  Wt 170 lb (77.111 kg)  BMI 23.05 kg/m2  SpO2 100%  Vital signs normal except for tachycardia  Physical Exam  Constitutional: He is oriented to person, place, and time. He appears well-developed and well-nourished.  Non-toxic appearance. He does not appear ill. No distress.  HENT:  Head: Normocephalic and atraumatic.  Right Ear: External ear normal.  Left Ear: External ear normal.  Nose: Nose normal. No mucosal edema or rhinorrhea.  Mouth/Throat: Oropharynx is clear and moist and mucous membranes are normal. No dental abscesses or uvula swelling.  Eyes: Conjunctivae and EOM are normal. Pupils are equal, round, and reactive to light.  Neck: Normal range of motion and full passive range of motion without pain. Neck supple.  Cardiovascular: Normal rate, regular rhythm and  normal heart sounds.  Exam reveals no gallop and no friction rub.   No murmur heard. Pulmonary/Chest: Effort normal and breath sounds normal. No respiratory distress. He has no wheezes. He has no rhonchi. He has no rales. He exhibits no tenderness and no crepitus.  Abdominal: Soft. Normal appearance and bowel sounds are normal. He exhibits no distension. There is no tenderness. There is no rebound and no guarding.  Musculoskeletal: Normal range of motion. He exhibits no edema or tenderness.  Moves all extremities well.   Neurological: He is  alert and oriented to person, place, and time. He has normal strength. No cranial nerve deficit.  Skin: Skin is warm, dry and intact. No rash noted. No erythema. No pallor.  Psychiatric: His affect is angry, blunt, labile and inappropriate. He is aggressive.  Loud, demanding  Nursing note and vitals reviewed.   ED Course  Procedures (including critical care time)  Medications - No data to display   nurse reports patient put something that looked like Sprite in the urine sample cup. He has been drinking well and he was given IV fluid bolus to help him produce a sample.   00:55 MOP is in the hall crying, she wants patient to be admitted to rehab until he can go to the facility on Jan 17th, states "I need help keeping him alive until he can be admitted". Wants a psychiatrist to evaluate patient. I have explained we need a urine on him, then I have have a mental health worker assess him, but it is very difficult to have someone committed for drug problems. She then states she will go to the magistrate and I informed her the same mental health worker would evaluate him to assess him for commitment.   01 10 patient is taking out his IV and pulling off his experience. He states he is leaving. He states he does not want to be evaluated by mental health worker and he does not want to stay any longer. He states he is going to rehabilitation in 2 days. Mother is crying again stating "I will be bringing back his death certificate". I have tried to explain to her he is not committable tonight. He is not suicidal or homicidal. He does have risky behavior that he is going to a drug rehabilitation in in 2 days. Mother patient is wanting the results of his urine drug screen to take to his parole officer. She was advised by the charge nurse that the pro officer would have to request the medical records unless the patient was agreeable to having the results given to the mother. Patient has already walked out of the  emergency department. Since the time I saw patient he has been awake and alert sitting at the bedside.  Labs Review Results for orders placed or performed during the hospital encounter of 01/15/15  Urine rapid drug screen (hosp performed)  Result Value Ref Range   Opiates POSITIVE (A) NONE DETECTED   Cocaine NONE DETECTED NONE DETECTED   Benzodiazepines POSITIVE (A) NONE DETECTED   Amphetamines NONE DETECTED NONE DETECTED   Tetrahydrocannabinol NONE DETECTED NONE DETECTED   Barbiturates NONE DETECTED NONE DETECTED  Urinalysis, Routine w reflex microscopic  Result Value Ref Range   Color, Urine YELLOW YELLOW   APPearance CLEAR CLEAR   Specific Gravity, Urine >1.030 (H) 1.005 - 1.030   pH 5.0 5.0 - 8.0   Glucose, UA NEGATIVE NEGATIVE mg/dL   Hgb urine dipstick NEGATIVE NEGATIVE   Bilirubin Urine  NEGATIVE NEGATIVE   Ketones, ur NEGATIVE NEGATIVE mg/dL   Protein, ur NEGATIVE NEGATIVE mg/dL   Nitrite NEGATIVE NEGATIVE   Leukocytes, UA NEGATIVE NEGATIVE  Acetaminophen level  Result Value Ref Range   Acetaminophen (Tylenol), Serum <10 (L) 10 - 30 ug/mL  Comprehensive metabolic panel  Result Value Ref Range   Sodium 140 135 - 145 mmol/L   Potassium 4.3 3.5 - 5.1 mmol/L   Chloride 104 101 - 111 mmol/L   CO2 30 22 - 32 mmol/L   Glucose, Bld 103 (H) 65 - 99 mg/dL   BUN 16 6 - 20 mg/dL   Creatinine, Ser 7.82 0.61 - 1.24 mg/dL   Calcium 9.7 8.9 - 95.6 mg/dL   Total Protein 7.5 6.5 - 8.1 g/dL   Albumin 4.6 3.5 - 5.0 g/dL   AST 23 15 - 41 U/L   ALT 34 17 - 63 U/L   Alkaline Phosphatase 64 38 - 126 U/L   Total Bilirubin 0.3 0.3 - 1.2 mg/dL   GFR calc non Af Amer >60 >60 mL/min   GFR calc Af Amer >60 >60 mL/min   Anion gap 6 5 - 15  Ethanol  Result Value Ref Range   Alcohol, Ethyl (B) <5 <5 mg/dL  Salicylate level  Result Value Ref Range   Salicylate Lvl <4.0 2.8 - 30.0 mg/dL  CBC with Differential  Result Value Ref Range   WBC 16.8 (H) 4.0 - 10.5 K/uL   RBC 4.46 4.22 - 5.81  MIL/uL   Hemoglobin 13.8 13.0 - 17.0 g/dL   HCT 21.3 08.6 - 57.8 %   MCV 92.8 78.0 - 100.0 fL   MCH 30.9 26.0 - 34.0 pg   MCHC 33.3 30.0 - 36.0 g/dL   RDW 46.9 62.9 - 52.8 %   Platelets 412 (H) 150 - 400 K/uL   Neutrophils Relative % 57 %   Lymphocytes Relative 31 %   Monocytes Relative 7 %   Eosinophils Relative 4 %   Basophils Relative 1 %   Band Neutrophils 0 %   Metamyelocytes Relative 0 %   Myelocytes 0 %   Promyelocytes Absolute 0 %   Blasts 0 %   nRBC 0 0 /100 WBC   Other 0 %   Neutro Abs 9.5 (H) 1.7 - 7.7 K/uL   Lymphs Abs 5.2 (H) 0.7 - 4.0 K/uL   Monocytes Absolute 1.2 (H) 0.1 - 1.0 K/uL   Eosinophils Absolute 0.7 0.0 - 0.7 K/uL   Basophils Absolute 0.2 (H) 0.0 - 0.1 K/uL   Laboratory interpretation all normal except leukocytosis   Imaging Review Dg Chest 1 View  01/15/2015  CLINICAL DATA:  Acute onset of altered mental status and lethargy. Initial encounter. EXAM: CHEST 1 VIEW COMPARISON:  Chest radiograph performed 07/24/2011 FINDINGS: The lungs are well-aerated. Mild vascular congestion is noted. There is no evidence of focal opacification, pleural effusion or pneumothorax. The cardiomediastinal silhouette is within normal limits. No acute osseous abnormalities are seen. IMPRESSION: Mild vascular congestion noted.  Lungs remain grossly clear. Electronically Signed   By: Roanna Raider M.D.   On: 01/15/2015 22:37   Ct Head Wo Contrast  01/15/2015  CLINICAL DATA:  Acute onset of altered mental status. Lethargy. Initial encounter. EXAM: CT HEAD WITHOUT CONTRAST TECHNIQUE: Contiguous axial images were obtained from the base of the skull through the vertex without intravenous contrast. COMPARISON:  CT of the head performed 08/07/2005 FINDINGS: There is no evidence of acute infarction, mass lesion, or intra-  or extra-axial hemorrhage on CT. The posterior fossa, including the cerebellum, brainstem and fourth ventricle, is within normal limits. The third and lateral ventricles,  and basal ganglia are unremarkable in appearance. The cerebral hemispheres are symmetric in appearance, with normal gray-white differentiation. No mass effect or midline shift is seen. There is no evidence of fracture; visualized osseous structures are unremarkable in appearance. The visualized portions of the orbits are within normal limits. The paranasal sinuses and mastoid air cells are well-aerated. No significant soft tissue abnormalities are seen. IMPRESSION: Unremarkable noncontrast CT of the head. Electronically Signed   By: Roanna Raider M.D.   On: 01/15/2015 22:35   I have personally reviewed and evaluated these images and lab results as part of my medical decision-making.   EKG Interpretation None      MDM   Final diagnoses:  Polysubstance abuse     Pt signed out AMA   Devoria Albe, MD, Concha Pyo, MD 01/16/15 [phone]

## 2015-05-03 ENCOUNTER — Ambulatory Visit: Payer: BLUE CROSS/BLUE SHIELD | Admitting: Family Medicine

## 2015-05-04 ENCOUNTER — Encounter: Payer: Self-pay | Admitting: Family

## 2015-05-04 ENCOUNTER — Encounter: Payer: Self-pay | Admitting: Family Medicine

## 2015-05-04 ENCOUNTER — Ambulatory Visit (INDEPENDENT_AMBULATORY_CARE_PROVIDER_SITE_OTHER): Payer: BLUE CROSS/BLUE SHIELD | Admitting: Family Medicine

## 2015-05-04 VITALS — BP 135/83 | HR 98 | Temp 97.6°F | Ht 72.0 in | Wt 177.0 lb

## 2015-05-04 DIAGNOSIS — J029 Acute pharyngitis, unspecified: Secondary | ICD-10-CM

## 2015-05-04 DIAGNOSIS — L700 Acne vulgaris: Secondary | ICD-10-CM | POA: Diagnosis not present

## 2015-05-04 LAB — CULTURE, GROUP A STREP

## 2015-05-04 LAB — RAPID STREP SCREEN (MED CTR MEBANE ONLY): STREP GP A AG, IA W/REFLEX: NEGATIVE

## 2015-05-04 MED ORDER — DOXYCYCLINE HYCLATE 100 MG PO TABS: 100.0000 mg | ORAL_TABLET | Freq: Two times a day (BID) | ORAL | Status: DC

## 2015-05-04 NOTE — Progress Notes (Signed)
   Subjective:    Patient ID: Marisa Severinimothy Vicente, male    DOB: 10-01-1990, 25 y.o.   MRN: [id number]  HPI [age] year old young man with history of sore throat but more than sore throat hoarseness for the past 3 or 4 days. He denies any fever headache cough sinus congestion. He is concerned about possibility of strep infection.  There are no active problems to display for this patient.  Outpatient Encounter Prescriptions as of 05/04/2015  Medication Sig  . [DISCONTINUED] cloNIDine (CATAPRES) 0.2 MG tablet Take 0.2 mg by mouth 3 (three) times daily as needed.   . [DISCONTINUED] dicyclomine (BENTYL) 10 MG capsule Take 10 mg by mouth 4 (four) times daily as needed for spasms.   . [DISCONTINUED] hydrOXYzine (ATARAX/VISTARIL) 50 MG tablet Take 50 mg by mouth every 6 (six) hours as needed for anxiety or itching.  . [DISCONTINUED] moxifloxacin (AVELOX) 400 MG tablet Take 400 mg by mouth daily.    No facility-administered encounter medications on file as of 05/04/2015.      Review of Systems  Constitutional: Negative.   HENT: Positive for sore throat and voice change.   Respiratory: Negative.   Cardiovascular: Negative.        Objective:   Physical Exam  Constitutional: He appears well-developed and well-nourished.  HENT:  Mouth/Throat: Oropharynx is clear and moist. No oropharyngeal exudate.  Cardiovascular: Normal rate and regular rhythm.   Pulmonary/Chest: Effort normal and breath sounds normal.          Assessment & Plan:  1. Sore throat Test is negative. I think his problem is more laryngitis and pharyngitis. His voice is hoarse and raspy. Will treat with sample of steroid inhaler. Rest voice is much as possible. Patient also requests some doxycycline. He has been treated with that previously for acne on his back and it has flared up in recent days. Rx for doxycycline 100 mg twice a day #30  Frederica KusterStephen M Sanjuana Mruk MD - Rapid strep screen (not at [facility])

## 2015-05-09 ENCOUNTER — Telehealth: Payer: Self-pay | Admitting: Family

## 2015-05-10 NOTE — Telephone Encounter (Signed)
Spoke with mother this morning and she stated that they found a program yesterday that he can attend. She states that they are putting him on a place today at 11am. Advised to contact our office with any further needs

## 2015-05-23 DIAGNOSIS — F17209 Nicotine dependence, unspecified, with unspecified nicotine-induced disorders: Secondary | ICD-10-CM | POA: Insufficient documentation

## 2015-05-23 DIAGNOSIS — F339 Major depressive disorder, recurrent, unspecified: Secondary | ICD-10-CM | POA: Insufficient documentation

## 2015-08-30 ENCOUNTER — Telehealth: Payer: Self-pay | Admitting: Family

## 2015-10-13 ENCOUNTER — Ambulatory Visit (INDEPENDENT_AMBULATORY_CARE_PROVIDER_SITE_OTHER): Payer: BLUE CROSS/BLUE SHIELD | Admitting: Family Medicine

## 2015-10-13 ENCOUNTER — Encounter: Payer: Self-pay | Admitting: Family Medicine

## 2015-10-13 ENCOUNTER — Telehealth: Payer: Self-pay | Admitting: Family

## 2015-10-13 VITALS — BP 109/74 | HR 114 | Temp 98.6°F | Ht 72.0 in | Wt 180.0 lb

## 2015-10-13 DIAGNOSIS — L03119 Cellulitis of unspecified part of limb: Secondary | ICD-10-CM

## 2015-10-13 MED ORDER — SULFAMETHOXAZOLE-TRIMETHOPRIM 800-160 MG PO TABS: 1.0000 | ORAL_TABLET | Freq: Two times a day (BID) | ORAL | 0 refills | Status: DC

## 2015-10-13 NOTE — Addendum Note (Signed)
Addended by: Arville CareETTINGER, Skylyn Slezak on: 10/13/2015 06:29 PM   Modules accepted: Orders

## 2015-10-13 NOTE — Progress Notes (Addendum)
BP 109/74 (BP Location: Left Arm, Patient Position: Sitting, Cuff Size: Normal)   Pulse (!) 114   Temp 98.6 F (37 C) (Oral)   Ht 6' (1.829 m)   Wt 180 lb (81.6 kg)   BMI 24.41 kg/m    Subjective:    Patient ID: Vedder Brittian, male    DOB: 02-13-1990, 25 y.o.   MRN: [id number]  HPI: Kaysin Brock is a 25 y.o. male presenting on 10/13/2015 for Foot Swelling (Bilateral foot/ankle swelling and pain since yesterday)   HPI Feet swelling bilateral Patient is having bilateral feet swelling and pain and redness and warmth in both of his feet. He did say he was just incarcerated and got out a few days ago last ago back in a couple weeks. He has never had this swelling before and does not know why all of a sudden popped up. He does admit that he has used Opana, he snorted and street oxycodone at high doses. He says that those pain medications are not helping him sleep. He denies any fevers or chills or shortness of breath or wheezing. He says the pain is 10 out of 10 in both feet and has difficulty walking because of the swelling and pain.  Relevant past medical, surgical, family and social history reviewed and updated as indicated. Interim medical history since our last visit reviewed. Allergies and medications reviewed and updated.  Review of Systems  Constitutional: Negative for chills and fever.  Eyes: Negative for discharge.  Respiratory: Negative for cough, shortness of breath and wheezing.   Cardiovascular: Positive for leg swelling. Negative for chest pain.  Musculoskeletal: Negative for back pain and gait problem.  Skin: Positive for color change. Negative for rash.  All other systems reviewed and are negative.   Per HPI unless specifically indicated above     Medication List       Accurate as of 10/13/15  5:51 PM. Always use your most recent med list.          doxycycline 100 MG tablet Commonly known as:  VIBRA-TABS Take 1 tablet (100 mg total) by mouth 2 (two)  times daily.          Objective:    BP 109/74 (BP Location: Left Arm, Patient Position: Sitting, Cuff Size: Normal)   Pulse (!) 114   Temp 98.6 F (37 C) (Oral)   Ht 6' (1.829 m)   Wt 180 lb (81.6 kg)   BMI 24.41 kg/m   Wt Readings from Last 3 Encounters:  10/13/15 180 lb (81.6 kg)  05/04/15 177 lb (80.3 kg)  01/15/15 170 lb (77.1 kg)    Physical Exam  Constitutional: He is oriented to person, place, and time. He appears well-developed and well-nourished. No distress.  Eyes: Conjunctivae are normal. Right eye exhibits no discharge. No scleral icterus.  Cardiovascular: Normal rate, regular rhythm, normal heart sounds and intact distal pulses.   No murmur heard. Pulmonary/Chest: Effort normal and breath sounds normal. No respiratory distress. He has no wheezes.  Musculoskeletal: Normal range of motion. He exhibits edema (Bilateral pitting edema of 2+ below the ankles with erythema and warmth on both tops of his feet.). He exhibits no deformity.  Neurological: He is alert and oriented to person, place, and time. Coordination normal.  Skin: Skin is warm and dry. No rash noted. He is not diaphoretic.  Psychiatric: He has a normal mood and affect. His behavior is normal.  Nursing note and vitals reviewed.     Assessment &  Plan:   Problem List Items Addressed This Visit    None    Visit Diagnoses    Cellulitis of lower extremity, unspecified laterality    -  Primary   Both feet appear red and hot and swollen, could be cellulitis, give Bactrim but will also test kidney function and liver function   Relevant Medications   sulfamethoxazole-trimethoprim (BACTRIM DS,SEPTRA DS) 800-160 MG tablet   Other Relevant Orders   CMP14+EGFR   CBC with Differential/Platelet   Sedimentation rate       Follow up plan: Return if symptoms worsen or fail to improve.  Counseling provided for all of the vaccine components Orders Placed This Encounter  Procedures  . CMP14+EGFR  . CBC with  Differential/Platelet  . Sedimentation rate    Caryl Pina, MD [facility] 10/13/2015, 5:51 PM

## 2015-10-13 NOTE — Telephone Encounter (Signed)
Appt given for today per mothers request 

## 2015-10-14 LAB — CMP14+EGFR
A/G RATIO: 1.5 (ref 1.2–2.2)
ALK PHOS: 66 IU/L (ref 39–117)
ALT: 29 IU/L (ref 0–44)
AST: 65 IU/L — AB (ref 0–40)
Albumin: 4.4 g/dL (ref 3.5–5.5)
BILIRUBIN TOTAL: 0.5 mg/dL (ref 0.0–1.2)
BUN/Creatinine Ratio: 11 (ref 9–20)
BUN: 10 mg/dL (ref 6–20)
CALCIUM: 9.5 mg/dL (ref 8.7–10.2)
CHLORIDE: 92 mmol/L — AB (ref 96–106)
CO2: 26 mmol/L (ref 18–29)
Creatinine, Ser: 0.92 mg/dL (ref 0.76–1.27)
GFR calc Af Amer: 133 mL/min/{1.73_m2} (ref >59)
GFR, EST NON AFRICAN AMERICAN: 115 mL/min/{1.73_m2} (ref >59)
GLOBULIN, TOTAL: 2.9 g/dL (ref 1.5–4.5)
Glucose: 61 mg/dL — ABNORMAL LOW (ref 65–99)
POTASSIUM: 4.7 mmol/L (ref 3.5–5.2)
SODIUM: 140 mmol/L (ref 134–144)
Total Protein: 7.3 g/dL (ref 6.0–8.5)

## 2015-10-14 LAB — CBC WITH DIFFERENTIAL/PLATELET
BASOS ABS: 0 10*3/uL (ref 0.0–0.2)
BASOS: 0 %
EOS (ABSOLUTE): 0.7 10*3/uL — AB (ref 0.0–0.4)
Eos: 6 %
HEMATOCRIT: 40.6 % (ref 37.5–51.0)
Hemoglobin: 13.5 g/dL (ref 12.6–17.7)
IMMATURE GRANULOCYTES: 0 %
Immature Grans (Abs): 0 10*3/uL (ref 0.0–0.1)
LYMPHS: 30 %
Lymphocytes Absolute: 3.5 10*3/uL — ABNORMAL HIGH (ref 0.7–3.1)
MCH: 30.7 pg (ref 26.6–33.0)
MCHC: 33.3 g/dL (ref 31.5–35.7)
MCV: 92 fL (ref 79–97)
MONOS ABS: 1.8 10*3/uL — AB (ref 0.1–0.9)
Monocytes: 15 %
NEUTROS ABS: 5.5 10*3/uL (ref 1.4–7.0)
NEUTROS PCT: 49 %
PLATELETS: 402 10*3/uL — AB (ref 150–379)
RBC: 4.4 x10E6/uL (ref 4.14–5.80)
RDW: 13.5 % (ref 12.3–15.4)
WBC: 11.6 10*3/uL — ABNORMAL HIGH (ref 3.4–10.8)

## 2015-10-14 LAB — SEDIMENTATION RATE: Sed Rate: 3 mm/hr (ref 0–15)

## 2015-10-19 ENCOUNTER — Ambulatory Visit (INDEPENDENT_AMBULATORY_CARE_PROVIDER_SITE_OTHER): Payer: BLUE CROSS/BLUE SHIELD | Admitting: Family Medicine

## 2015-10-19 ENCOUNTER — Encounter: Payer: Self-pay | Admitting: Family Medicine

## 2015-10-19 VITALS — BP 111/73 | HR 91 | Temp 98.0°F | Ht 72.0 in | Wt 180.5 lb

## 2015-10-19 DIAGNOSIS — G4700 Insomnia, unspecified: Secondary | ICD-10-CM

## 2015-10-19 DIAGNOSIS — R748 Abnormal levels of other serum enzymes: Secondary | ICD-10-CM

## 2015-10-19 DIAGNOSIS — F1920 Other psychoactive substance dependence, uncomplicated: Secondary | ICD-10-CM

## 2015-10-19 DIAGNOSIS — L03119 Cellulitis of unspecified part of limb: Secondary | ICD-10-CM | POA: Diagnosis not present

## 2015-10-19 MED ORDER — TRAZODONE HCL 50 MG PO TABS
25.0000 mg | ORAL_TABLET | Freq: Every evening | ORAL | 3 refills | Status: DC | PRN
Start: 2015-10-19 — End: 2022-06-05

## 2015-10-19 NOTE — Progress Notes (Signed)
BP 111/73   Pulse 91   Temp 98 F (36.7 C) (Oral)   Ht 6' (1.829 m)   Wt 180 lb 8 oz (81.9 kg)   BMI 24.48 kg/m    Subjective:    Patient ID: Shane Carr, male    DOB: 1990/02/24, 25 y.o.   MRN: [id number]  HPI: Shane Carr is a 25 y.o. male presenting on 10/19/2015 for Followup swelling in feet (feet turn purple with any extended standing; patient reports he is only taking antibiotic twice daily)   HPI Swelling of feet follow-up Patient comes in today to follow up with his complaint of swelling in his feet. He was seen last week for the same. He was given an antibiotic to help. He says the swelling has been reduced but it is still tender. His mother comes in with him today concerned about it and wants to know possible causes. Last time he was here we tested his kidneys as well as liver his blood counts among other things. His liver count was slightly elevated but not significantly so point that would be the cause of the swelling. His white blood cell count was also elevated which gives me the confirmation of possible infection. The redness and warmth has greatly reduced. He says he is still having some pain with ambulation. He does admit to abusing narcotics before and therefore has a low pain threshold. He denies any fevers or chills.  Insomnia Patient has been having trouble with insomnia and can't sleep at night. This is likely because of his extensive drug abuse history and allergies altered his sleep-wake cycles. Patient understands this facility on something help rather than doing behavioral therapy changes.  Elevated liver enzymes and history of drug abuse Patient's mother is talked him into trying to do a urine drug screen and patient also had elevated liver enzymes on his last labs. He needs to hepatitis panel. He denies any abdominal pain or yellowing of skin. Denies any blood in the stool.  Relevant past medical, surgical, family and social history reviewed and  updated as indicated. Interim medical history since our last visit reviewed. Allergies and medications reviewed and updated.  Review of Systems  Constitutional: Negative for chills and fever.  Eyes: Negative for discharge.  Respiratory: Negative for shortness of breath and wheezing.   Cardiovascular: Positive for leg swelling. Negative for chest pain.  Musculoskeletal: Negative for back pain and gait problem.  Skin: Positive for color change. Negative for rash.  Psychiatric/Behavioral: Positive for sleep disturbance.  All other systems reviewed and are negative.   Per HPI unless specifically indicated above     Medication List       Accurate as of 10/19/15  9:24 AM. Always use your most recent med list.          sulfamethoxazole-trimethoprim 800-160 MG tablet Commonly known as:  BACTRIM DS,SEPTRA DS Take 1 tablet by mouth 2 (two) times daily.          Objective:    BP 111/73   Pulse 91   Temp 98 F (36.7 C) (Oral)   Ht 6' (1.829 m)   Wt 180 lb 8 oz (81.9 kg)   BMI 24.48 kg/m   Wt Readings from Last 3 Encounters:  10/19/15 180 lb 8 oz (81.9 kg)  10/13/15 180 lb (81.6 kg)  05/04/15 177 lb (80.3 kg)    Physical Exam  Constitutional: He is oriented to person, place, and time. He appears well-developed and well-nourished. No distress.  Eyes: Conjunctivae are normal. Right eye exhibits no discharge. No scleral icterus.  Cardiovascular: Normal rate, regular rhythm, normal heart sounds and intact distal pulses.   No murmur heard. Pulmonary/Chest: Effort normal and breath sounds normal. No respiratory distress. He has no wheezes.  Musculoskeletal: Normal range of motion. He exhibits edema (Patient has edema and tenderness in bilateral feet. There is still little bit of erythema but the warmth has come down greatly and the edema is more like 1+ now when it was closer to 2 or 3+ before.) and tenderness.  Neurological: He is alert and oriented to person, place, and time.  Coordination normal.  Skin: Skin is warm and dry. No rash noted. He is not diaphoretic.  Psychiatric: He has a normal mood and affect. His behavior is normal.  Nursing note and vitals reviewed.     Assessment & Plan:   Problem List Items Addressed This Visit    None    Visit Diagnoses    Cellulitis of lower extremity, unspecified laterality    -  Primary   Patient has improved greatly and is not taking antibiotic like he should. Recommend for him to take it more regularly.   Drug abuse and dependence (HCC)       Elevated liver enzymes       Relevant Orders   Hepatitis C antibody (Completed)   Hepatitis B surface antibody (Completed)   Hepatitis B E Antibody (Completed)   Hepatitis B core antibody, IgM (Completed)   Hepatitis B core antibody, total (Completed)   Insomnia, unspecified type       Relevant Medications   traZODone (DESYREL) 50 MG tablet      Follow up plan: Return if symptoms worsen or fail to improve.  Counseling provided for all of the vaccine components Orders Placed This Encounter  Procedures  . ToxASSURE Select 13 (MW), Urine  . Hepatitis C antibody  . Hepatitis B surface antibody  . Hepatitis B E Antibody  . Hepatitis B core antibody, IgM  . Hepatitis B core antibody, total    Arville CareJoshua Arijana Narayan, MD [facility] 10/19/2015, 9:24 AM

## 2015-10-20 LAB — HEPATITIS PANEL, ACUTE
Hep A IgM: NEGATIVE
Hep B C IgM: NEGATIVE
Hepatitis B Surface Ag: NEGATIVE

## 2015-10-20 LAB — HEPATITIS B SURFACE ANTIBODY, QUANTITATIVE: Hepatitis B Surf Ab Quant: <3.1 m[IU]/mL — ABNORMAL LOW (ref >9.9)

## 2015-10-20 LAB — SPECIMEN STATUS REPORT

## 2015-10-20 LAB — HEPATITIS B CORE ANTIBODY, IGM: Hep B C IgM: NEGATIVE

## 2015-10-20 LAB — HEPATITIS C ANTIBODY: Hep C Virus Ab: <0.1 s/co ratio (ref 0.0–0.9)

## 2015-10-20 LAB — HEPATITIS B E ANTIBODY: HEP B E AB: NEGATIVE

## 2015-10-20 LAB — HEPATITIS B CORE ANTIBODY, TOTAL: Hep B Core Total Ab: NEGATIVE

## 2015-10-26 ENCOUNTER — Telehealth: Payer: Self-pay | Admitting: Family Medicine

## 2015-10-26 NOTE — Telephone Encounter (Signed)
Mom is on DPR she is aware that drug screen was not performed at last visit

## 2015-11-23 ENCOUNTER — Ambulatory Visit: Payer: BLUE CROSS/BLUE SHIELD | Admitting: Family Medicine

## 2016-02-21 IMAGING — DX DG CHEST 1V
1 series · 1 of 1 positions shown · non-contrast
Comparison: Chest radiograph performed 07/24/2011

CLINICAL DATA: Acute onset of altered mental status and lethargy.
Initial encounter.

EXAM:
CHEST 1 VIEW

[chest pa]
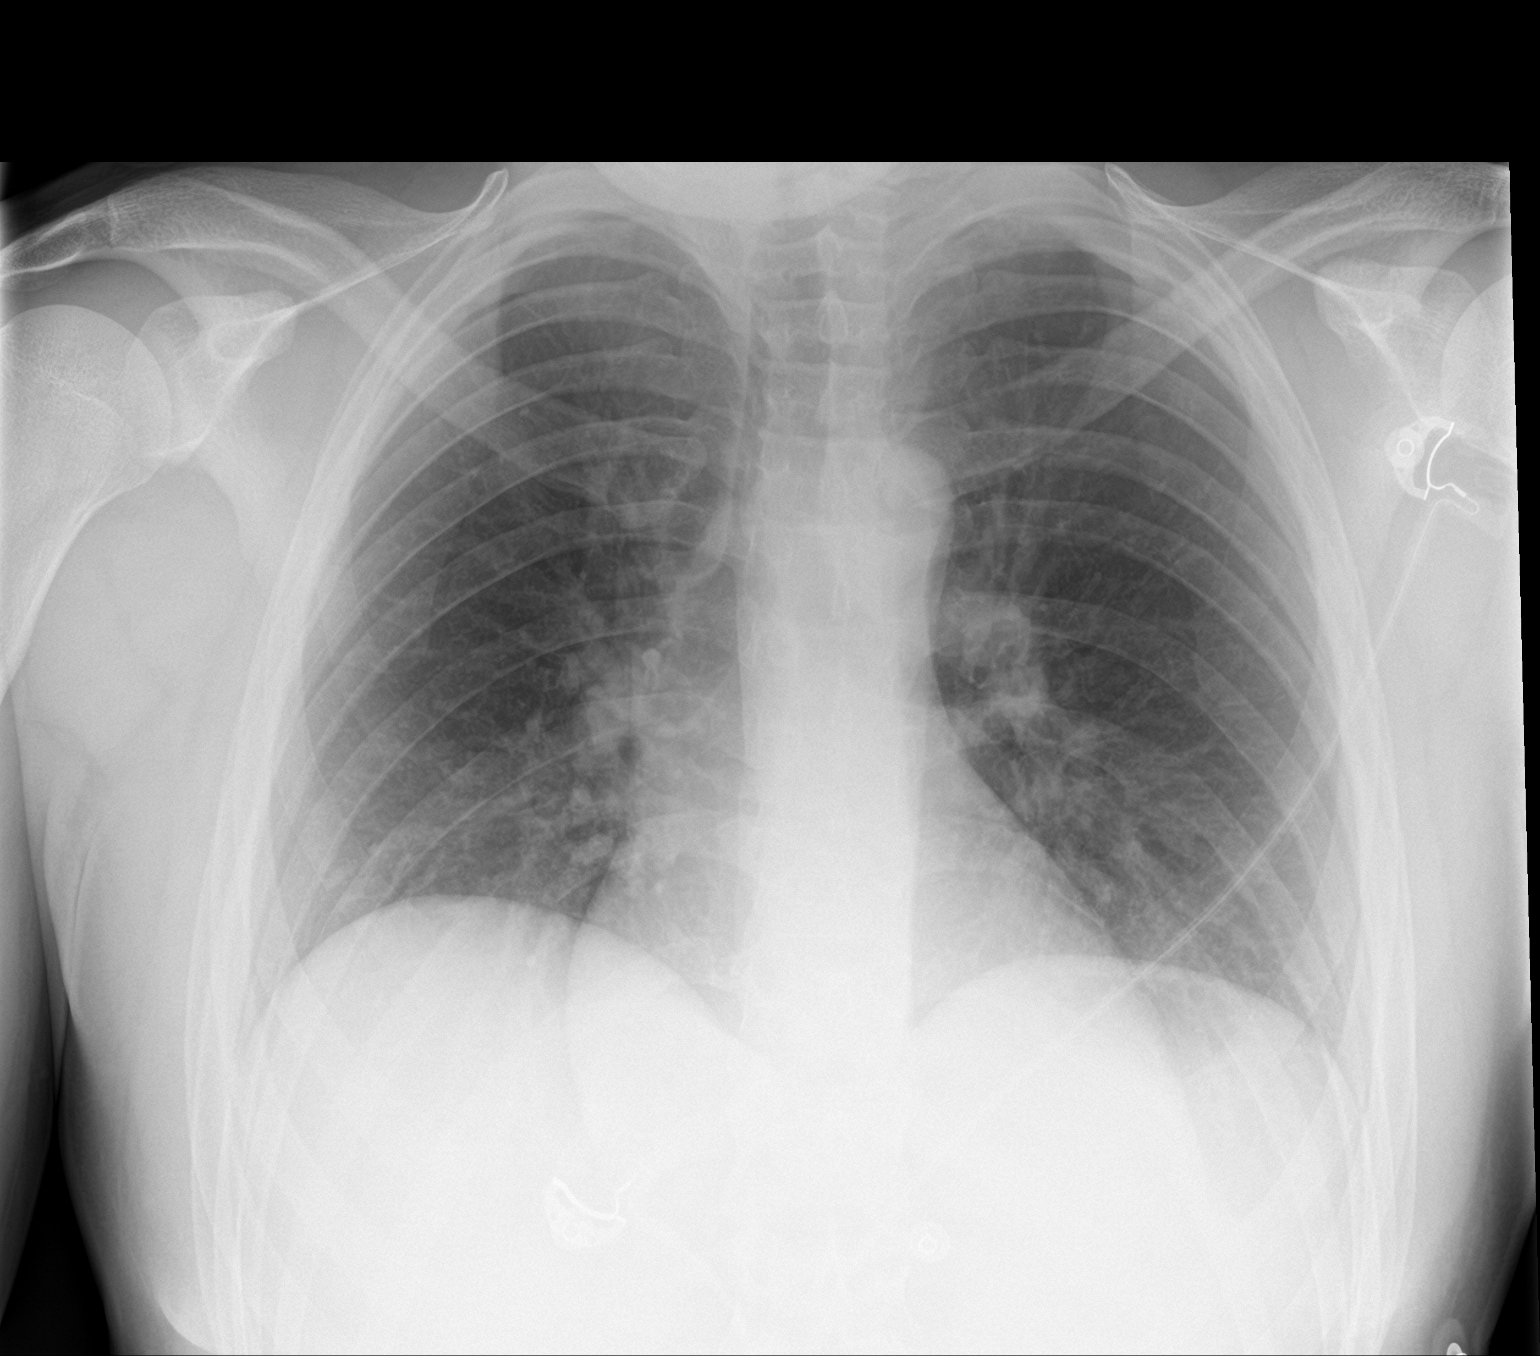

[1 of 1 positions shown; findings below may reference images not displayed]

FINDINGS: The lungs are well-aerated. Mild vascular congestion is noted. There
is no evidence of focal opacification, pleural effusion or
pneumothorax.

The cardiomediastinal silhouette is within normal limits. No acute
osseous abnormalities are seen.
IMPRESSION: Mild vascular congestion noted.  Lungs remain grossly clear.

## 2017-03-05 ENCOUNTER — Ambulatory Visit: Payer: BLUE CROSS/BLUE SHIELD | Admitting: Family Medicine

## 2017-03-06 ENCOUNTER — Encounter: Payer: Self-pay | Admitting: Family

## 2020-10-29 ENCOUNTER — Encounter (HOSPITAL_COMMUNITY): Payer: Self-pay

## 2020-10-29 ENCOUNTER — Emergency Department (HOSPITAL_COMMUNITY)
Admission: EM | Admit: 2020-10-29 | Discharge: 2020-10-29 | Disposition: A | Discharge status: Home / Self Care | Payer: BLUE CROSS/BLUE SHIELD | Attending: Emergency Medicine | Admitting: Emergency Medicine

## 2020-10-29 DIAGNOSIS — Z91040 Latex allergy status: Secondary | ICD-10-CM | POA: Insufficient documentation

## 2020-10-29 DIAGNOSIS — T50911A Poisoning by multiple unspecified drugs, medicaments and biological substances, accidental (unintentional), initial encounter: Secondary | ICD-10-CM | POA: Insufficient documentation

## 2020-10-29 DIAGNOSIS — T50901A Poisoning by unspecified drugs, medicaments and biological substances, accidental (unintentional), initial encounter: Secondary | ICD-10-CM

## 2020-10-29 DIAGNOSIS — Z87891 Personal history of nicotine dependence: Secondary | ICD-10-CM | POA: Insufficient documentation

## 2020-10-29 LAB — COMPREHENSIVE METABOLIC PANEL
ALT: 20 U/L (ref 0–44)
AST: 27 U/L (ref 15–41)
Albumin: 4.3 g/dL (ref 3.5–5.0)
Alkaline Phosphatase: 51 U/L (ref 38–126)
Anion gap: 7 (ref 5–15)
BUN: 15 mg/dL (ref 6–20)
CO2: 28 mmol/L (ref 22–32)
Calcium: 9.9 mg/dL (ref 8.9–10.3)
Chloride: 104 mmol/L (ref 98–111)
Creatinine, Ser: 0.73 mg/dL (ref 0.61–1.24)
GFR, Estimated: >60 mL/min (ref >60)
Glucose, Bld: 96 mg/dL (ref 70–99)
Potassium: 3.9 mmol/L (ref 3.5–5.1)
Sodium: 139 mmol/L (ref 135–145)
Total Bilirubin: 0.4 mg/dL (ref 0.3–1.2)
Total Protein: 7.1 g/dL (ref 6.5–8.1)

## 2020-10-29 LAB — RAPID URINE DRUG SCREEN, HOSP PERFORMED
Amphetamines: NOT DETECTED
Barbiturates: NOT DETECTED
Benzodiazepines: POSITIVE — AB
Cocaine: POSITIVE — AB
Opiates: NOT DETECTED
Tetrahydrocannabinol: POSITIVE — AB

## 2020-10-29 LAB — CBC WITH DIFFERENTIAL/PLATELET
Abs Immature Granulocytes: 0.03 10*3/uL (ref 0.00–0.07)
Basophils Absolute: 0.1 10*3/uL (ref 0.0–0.1)
Basophils Relative: 1 %
Eosinophils Absolute: 0.4 10*3/uL (ref 0.0–0.5)
Eosinophils Relative: 3 %
HCT: 38.8 % — ABNORMAL LOW (ref 39.0–52.0)
Hemoglobin: 13.5 g/dL (ref 13.0–17.0)
Immature Granulocytes: 0 %
Lymphocytes Relative: 24 %
Lymphs Abs: 2.7 10*3/uL (ref 0.7–4.0)
MCH: 31.2 pg (ref 26.0–34.0)
MCHC: 34.8 g/dL (ref 30.0–36.0)
MCV: 89.6 fL (ref 80.0–100.0)
Monocytes Absolute: 1.7 10*3/uL — ABNORMAL HIGH (ref 0.1–1.0)
Monocytes Relative: 15 %
Neutro Abs: 6.4 10*3/uL (ref 1.7–7.7)
Neutrophils Relative %: 57 %
Platelets: 378 10*3/uL (ref 150–400)
RBC: 4.33 MIL/uL (ref 4.22–5.81)
RDW: 13.5 % (ref 11.5–15.5)
WBC: 11.3 10*3/uL — ABNORMAL HIGH (ref 4.0–10.5)
nRBC: 0 % (ref 0.0–0.2)

## 2020-10-29 LAB — ACETAMINOPHEN LEVEL: Acetaminophen (Tylenol), Serum: <10 ug/mL — ABNORMAL LOW (ref 10–30)

## 2020-10-29 LAB — ETHANOL: Alcohol, Ethyl (B): <10 mg/dL (ref <10)

## 2020-10-29 LAB — SALICYLATE LEVEL: Salicylate Lvl: <7 mg/dL — ABNORMAL LOW (ref 7.0–30.0)

## 2020-10-29 MED ORDER — MIDAZOLAM HCL 5 MG/5ML IJ SOLN: 5.0000 mg | Freq: Once | INTRAMUSCULAR | Status: DC

## 2020-10-29 MED ORDER — SODIUM CHLORIDE 0.9 % IV BOLUS: 1000.0000 mL | Freq: Once | INTRAVENOUS | Status: DC

## 2020-10-29 MED ORDER — MIDAZOLAM HCL 5 MG/5ML IJ SOLN
INTRAMUSCULAR | Status: AC
  Administered 2020-10-29: 5 mg
  Filled 2020-10-29: qty 5

## 2020-10-29 MED ORDER — LORAZEPAM 2 MG/ML IJ SOLN
INTRAMUSCULAR | Status: AC
  Filled 2020-10-29: qty 1

## 2020-10-29 MED ORDER — NALOXONE HCL 4 MG/0.1ML NA LIQD: NASAL | 0 refills | Status: DC

## 2020-10-29 NOTE — ED Triage Notes (Signed)
Pt brought in by EMS due to suspected overdose per family. Given 2 rounds of narcan PTA. Family told ems pt uses heroin, xanax, some meth.

## 2020-10-29 NOTE — ED Provider Notes (Signed)
[facility] EMERGENCY DEPARTMENT Provider Note   CSN: [id number] Arrival date & time: 10/29/20  1306     History Chief Complaint  Patient presents with   Drug Overdose    Shane Carr is a 30 y.o. male.  With past medical history of polysubstance use disorder presents emergency department after drug overdose.  Brought in by EMS from significant other's house.  They state significant other called mother to the house where she found him unresponsive.  She attempted to sternal rub him with minimal responsiveness so she gave him 4 mg intranasal Narcan.  Per report he had responsiveness to this and ended up getting another dose of intranasal Narcan prior to EMS arrival.  EMS states that they did not have to reNarcan him in route to the emergency department however he was somewhat combative.   In the emergency department patient is verbally and physically aggressive with staff. Stating that he wants to leave.  He required restraints and IM Versed for an assessment.  After assessment he denies any pain.  He states that he only "smoked weed."  Denies SI/HI/AVH.  Mother presented to the emergency department and states that he has been using "green monster Xanax."  She states that he also uses meth, heroin, cocaine.  She states that he has overdosed 6 times in the past month.    Drug Overdose      Past Medical History:  Diagnosis Date   Polysubstance abuse (HCC)    opana, meth, xanax, heroin, adderal    There are no problems to display for this patient.   Past Surgical History:  Procedure Laterality Date   SPLENECTOMY, TOTAL         Family History  Problem Relation Age of Onset   Heart disease Father    Diabetes Father     Social History   Tobacco Use   Smoking status: Former   Smokeless tobacco: Current    Types: Snuff  Substance Use Topics   Alcohol use: Yes    Alcohol/week: 13.0 standard drinks    Types: 1 Standard drinks or equivalent, 12 Cans of beer per week    Drug use: No    Types: Methamphetamines    Comment: Rehab released 04/21/15 adderal, xanax, meth, opana, heroin    Home Medications Prior to Admission medications   Medication Sig Start Date End Date Taking? Authorizing Provider  sulfamethoxazole-trimethoprim (BACTRIM DS,SEPTRA DS) 800-160 MG tablet Take 1 tablet by mouth 2 (two) times daily. 10/13/15   Dettinger, Elige Radon, MD  traZODone (DESYREL) 50 MG tablet Take 0.5-1 tablets (25-50 mg total) by mouth at bedtime as needed for sleep. 10/19/15   Dettinger, Elige Radon, MD    Allergies    Latex  Review of Systems   Review of Systems  Psychiatric/Behavioral:  Positive for agitation and confusion.   All other systems reviewed and are negative.  Physical Exam Updated Vital Signs BP 108/66 (BP Location: Left Arm)   Ht 5\' 11"  (1.803 m)   Wt 72.6 kg   BMI 22.32 kg/m   Physical Exam Vitals and nursing note reviewed. Exam conducted with a chaperone present.  Constitutional:      Appearance: Normal appearance.  HENT:     Head: Normocephalic and atraumatic.     Nose: Nose normal.     Mouth/Throat:     Mouth: Mucous membranes are moist.  Eyes:     General: No scleral icterus.    Extraocular Movements: Extraocular movements intact.  Pupils: Pupils are equal, round, and reactive to light.  Cardiovascular:     Rate and Rhythm: Normal rate and regular rhythm.     Pulses: Normal pulses.     Heart sounds: No murmur heard. Pulmonary:     Effort: Pulmonary effort is normal. No respiratory distress.     Breath sounds: Normal breath sounds.  Abdominal:     General: Bowel sounds are normal. There is no distension.     Palpations: Abdomen is soft.     Tenderness: There is no abdominal tenderness.  Musculoskeletal:        General: Normal range of motion.     Cervical back: Normal range of motion and neck supple.  Skin:    General: Skin is warm and dry.     Capillary Refill: Capillary refill takes less than 2 seconds.   Neurological:     General: No focal deficit present.     Mental Status: He is alert.     GCS: GCS eye subscore is 4. GCS verbal subscore is 4. GCS motor subscore is 6.     Motor: Motor function is intact.  Psychiatric:        Attention and Perception: He does not perceive auditory or visual hallucinations.        Mood and Affect: Affect is labile and angry.        Speech: Speech normal.        Behavior: Behavior is uncooperative, agitated, aggressive and combative.        Thought Content: Thought content is not paranoid or delusional. Thought content does not include homicidal or suicidal ideation.        Cognition and Memory: Cognition is impaired.        Judgment: Judgment is impulsive and inappropriate.    ED Results / Procedures / Treatments   Labs (all labs ordered are listed, but only abnormal results are displayed) Labs Reviewed  SALICYLATE LEVEL - Abnormal; Notable for the following components:      Result Value   Salicylate Lvl <7.0 (*)    All other components within normal limits  ACETAMINOPHEN LEVEL - Abnormal; Notable for the following components:   Acetaminophen (Tylenol), Serum <10 (*)    All other components within normal limits  RAPID URINE DRUG SCREEN, HOSP PERFORMED - Abnormal; Notable for the following components:   Cocaine POSITIVE (*)    Benzodiazepines POSITIVE (*)    Tetrahydrocannabinol POSITIVE (*)    All other components within normal limits  CBC WITH DIFFERENTIAL/PLATELET - Abnormal; Notable for the following components:   WBC 11.3 (*)    HCT 38.8 (*)    Monocytes Absolute 1.7 (*)    All other components within normal limits  COMPREHENSIVE METABOLIC PANEL  ETHANOL  CBG MONITORING, ED    EKG None  Radiology No results found.  Procedures Procedures   Medications Ordered in ED Medications  sodium chloride 0.9 % bolus 1,000 mL (has no administration in time range)  midazolam (VERSED) 5 MG/5ML injection 5 mg (has no administration in time  range)  midazolam (VERSED) 5 MG/5ML injection (5 mg  Given 10/29/20 1333)    ED Course  I have reviewed the triage vital signs and the nursing notes.  Pertinent labs & imaging results that were available during my care of the patient were reviewed by me and considered in my medical decision making (see chart for details).  1333: Security called to bedside for agitation and aggression towards staff.  Patient required  four-point restraints and administration of 5 mg IM Versed.  Will reassess patient. 1410: Bilateral lower extremity restraints removed.  Patient continues to be in soft wrist restraints.  He is still verbally aggressive, removing medical equipment. MDM Rules/Calculators/A&P 30 year old male who presents to emergency department after overdose on unknown substance.  Received x2 4 mg Narcan intranasally prior to arrival by mother. Has not required Narcan here in the emergency department. Initially presented very agitated and was verbally and physically aggressive with staff requiring IM Versed. Lab work is grossly unremarkable.  UA is positive for cocaine, benzos, THC. Throughout his stay he is got himself out of bilateral wrist restraints, gone to the bathroom and redressed himself. The mother at bedside is asking why he needs to stay.  I have repeatedly discussed with her that the standard of care is observation for 4 hours after Narcan administration.  After lengthy conversation with the mother and patient, agrees to stay for 4 hours. He continues to deny that this was an intentional overdose and does not have any suicidal ideations. Patient has had no further somnolence or unresponsiveness requiring repeat Narcan while he has been here.  He is able to ambulate without assistance.  He is alert and oriented.  He tolerates p.o. He understands to return to emergency department should he have repeat overdose.  Will discharge with intranasal Narcan prescription. stable for  discharge Final Clinical Impression(s) / ED Diagnoses Final diagnoses:  Accidental drug overdose, initial encounter    Rx / DC Orders ED Discharge Orders     None        Cristopher Peru, PA-C 10/29/20 1620    Sloan Leiter, DO 10/29/20 1622

## 2020-10-29 NOTE — Discharge Instructions (Addendum)
You were seen in the emergency department today for drug overdose.  You were given Narcan which improved your symptoms.  I have prescribed you with a prescription for Narcan for home.  Please utilize community resources for rehab and detox.  Please return to the emergency department for any reason including subsequent overdoses.

## 2020-10-29 NOTE — ED Notes (Signed)
Pts. Mom has removed pts. Restraints. Pts. Mom got pt. Out of bed and helped pt. Get dressed. PA is at bed side to explain why we need to monitor pt after giving narcan. Security and Patent examiner are at bedside to help escort mom out.

## 2020-10-29 NOTE — ED Notes (Addendum)
ED provider at bedside. Discussing removal of restraints with pt and mother. Pt instructed that bilateral foot restraints will be removed per PA Lauren. Pt and mother educated about no violence toward staff. Verbal agreement from pt and mom.

## 2020-10-29 NOTE — ED Notes (Signed)
Pt confused, ripping leads off, disoriented, interfering with care. Will not allow medical staff to start IV and treatment. EDP at bedside, order received for non violent bilateral soft wrist and ankle restraints.

## 2020-10-29 NOTE — ED Notes (Signed)
Pt removed all leads and wires, Pt mother is requesting removal of the IV so she can take him home. PA advised and is en route to speak with pt and family.

## 2020-10-29 NOTE — ED Notes (Addendum)
Pts. EKG wires were off again. Notified PA stated as long as O2 is on we could allow that. Placed O2 monitor back on pt. Notified pt. That as long as O2 monitor is on we can leave EKG wires off. Mom stated " He doesn't need it, look he's snoring away." O2 monitor placed on pts. Finger.

## 2020-10-29 NOTE — ED Notes (Signed)
Attempted to reconnect pt to monitors and to obtain vital signs. Pt refused.

## 2021-07-15 ENCOUNTER — Encounter (HOSPITAL_COMMUNITY): Payer: Self-pay | Admitting: Emergency Medicine

## 2021-07-15 ENCOUNTER — Other Ambulatory Visit: Payer: Self-pay

## 2021-07-15 ENCOUNTER — Emergency Department (HOSPITAL_COMMUNITY): Payer: Self-pay

## 2021-07-15 ENCOUNTER — Emergency Department (HOSPITAL_COMMUNITY)
Admission: EM | Admit: 2021-07-15 | Discharge: 2021-07-15 | Disposition: A | Discharge status: Home / Self Care | Payer: Self-pay | Attending: Emergency Medicine | Admitting: Emergency Medicine

## 2021-07-15 DIAGNOSIS — Z91040 Latex allergy status: Secondary | ICD-10-CM | POA: Insufficient documentation

## 2021-07-15 DIAGNOSIS — Y92410 Unspecified street and highway as the place of occurrence of the external cause: Secondary | ICD-10-CM | POA: Insufficient documentation

## 2021-07-15 DIAGNOSIS — R109 Unspecified abdominal pain: Secondary | ICD-10-CM | POA: Insufficient documentation

## 2021-07-15 LAB — TYPE AND SCREEN
ABO/RH(D): O POS
Antibody Screen: NEGATIVE

## 2021-07-15 LAB — CBC WITH DIFFERENTIAL/PLATELET
Abs Immature Granulocytes: 0.05 10*3/uL (ref 0.00–0.07)
Basophils Absolute: 0.1 10*3/uL (ref 0.0–0.1)
Basophils Relative: 0 %
Eosinophils Absolute: 0.1 10*3/uL (ref 0.0–0.5)
Eosinophils Relative: 0 %
HCT: 43.2 % (ref 39.0–52.0)
Hemoglobin: 14.6 g/dL (ref 13.0–17.0)
Immature Granulocytes: 0 %
Lymphocytes Relative: 12 %
Lymphs Abs: 1.7 10*3/uL (ref 0.7–4.0)
MCH: 30.2 pg (ref 26.0–34.0)
MCHC: 33.8 g/dL (ref 30.0–36.0)
MCV: 89.3 fL (ref 80.0–100.0)
Monocytes Absolute: 1.7 10*3/uL — ABNORMAL HIGH (ref 0.1–1.0)
Monocytes Relative: 12 %
Neutro Abs: 10.6 10*3/uL — ABNORMAL HIGH (ref 1.7–7.7)
Neutrophils Relative %: 76 %
Platelets: 372 10*3/uL (ref 150–400)
RBC: 4.84 MIL/uL (ref 4.22–5.81)
RDW: 13 % (ref 11.5–15.5)
WBC: 14.1 10*3/uL — ABNORMAL HIGH (ref 4.0–10.5)
nRBC: 0 % (ref 0.0–0.2)

## 2021-07-15 LAB — RAPID URINE DRUG SCREEN, HOSP PERFORMED
Amphetamines: POSITIVE — AB
Barbiturates: NOT DETECTED
Benzodiazepines: POSITIVE — AB
Cocaine: POSITIVE — AB
Opiates: NOT DETECTED
Tetrahydrocannabinol: POSITIVE — AB

## 2021-07-15 LAB — COMPREHENSIVE METABOLIC PANEL
ALT: 23 U/L (ref 0–44)
AST: 36 U/L (ref 15–41)
Albumin: 4.8 g/dL (ref 3.5–5.0)
Alkaline Phosphatase: 59 U/L (ref 38–126)
Anion gap: 11 (ref 5–15)
BUN: 13 mg/dL (ref 6–20)
CO2: 27 mmol/L (ref 22–32)
Calcium: 9.6 mg/dL (ref 8.9–10.3)
Chloride: 97 mmol/L — ABNORMAL LOW (ref 98–111)
Creatinine, Ser: 1.01 mg/dL (ref 0.61–1.24)
GFR, Estimated: >60 mL/min (ref >60)
Glucose, Bld: 80 mg/dL (ref 70–99)
Potassium: 3.1 mmol/L — ABNORMAL LOW (ref 3.5–5.1)
Sodium: 135 mmol/L (ref 135–145)
Total Bilirubin: 0.6 mg/dL (ref 0.3–1.2)
Total Protein: 8.2 g/dL — ABNORMAL HIGH (ref 6.5–8.1)

## 2021-07-15 LAB — I-STAT CHEM 8, ED
BUN: 12 mg/dL (ref 6–20)
Calcium, Ion: 1.21 mmol/L (ref 1.15–1.40)
Chloride: 96 mmol/L — ABNORMAL LOW (ref 98–111)
Creatinine, Ser: 0.9 mg/dL (ref 0.61–1.24)
Glucose, Bld: 76 mg/dL (ref 70–99)
HCT: 47 % (ref 39.0–52.0)
Hemoglobin: 16 g/dL (ref 13.0–17.0)
Potassium: 3.2 mmol/L — ABNORMAL LOW (ref 3.5–5.1)
Sodium: 137 mmol/L (ref 135–145)
TCO2: 30 mmol/L (ref 22–32)

## 2021-07-15 LAB — URINALYSIS, ROUTINE W REFLEX MICROSCOPIC
Bacteria, UA: NONE SEEN
Bilirubin Urine: NEGATIVE
Glucose, UA: NEGATIVE mg/dL
Ketones, ur: NEGATIVE mg/dL
Leukocytes,Ua: NEGATIVE
Nitrite: NEGATIVE
Protein, ur: NEGATIVE mg/dL
Specific Gravity, Urine: 1.026 (ref 1.005–1.030)
pH: 6 (ref 5.0–8.0)

## 2021-07-15 LAB — ETHANOL: Alcohol, Ethyl (B): <10 mg/dL (ref <10)

## 2021-07-15 MED ORDER — IOHEXOL 300 MG/ML  SOLN
100.0000 mL | Freq: Once | INTRAMUSCULAR | Status: AC | PRN
  Administered 2021-07-15: 100 mL via INTRAVENOUS

## 2021-07-15 MED ORDER — HYDROMORPHONE HCL 1 MG/ML IJ SOLN
0.5000 mg | Freq: Once | INTRAMUSCULAR | Status: AC
  Administered 2021-07-15: 0.5 mg via INTRAVENOUS
  Filled 2021-07-15: qty 0.5

## 2021-07-15 MED ORDER — OXYCODONE-ACETAMINOPHEN 5-325 MG PO TABS: ORAL_TABLET | ORAL | 0 refills | Status: DC

## 2021-07-15 MED ORDER — OXYCODONE HCL 5 MG PO TABS: ORAL_TABLET | ORAL | 0 refills | Status: DC

## 2021-07-15 MED ORDER — SODIUM CHLORIDE 0.9 % IV BOLUS
1000.0000 mL | Freq: Once | INTRAVENOUS | Status: AC
  Administered 2021-07-15: 1000 mL via INTRAVENOUS

## 2021-07-15 NOTE — ED Notes (Signed)
C-collar removed by Dr. Estell Harpin.

## 2021-07-15 NOTE — ED Notes (Signed)
Patient transported to CT 

## 2021-07-15 NOTE — Discharge Instructions (Signed)
Follow-up with Dr. Susa Simmonds next week for your broken arm.  Also he can follow you up for your bruising clean abrasions twice a day with soap and water
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
## 2021-09-20 ENCOUNTER — Encounter (HOSPITAL_COMMUNITY): Payer: Self-pay

## 2021-09-20 ENCOUNTER — Emergency Department (HOSPITAL_COMMUNITY)
Admission: EM | Admit: 2021-09-20 | Discharge: 2021-09-20 | Disposition: A | Discharge status: Home / Self Care | Payer: Self-pay | Attending: Emergency Medicine | Admitting: Emergency Medicine

## 2021-09-20 DIAGNOSIS — L02414 Cutaneous abscess of left upper limb: Secondary | ICD-10-CM | POA: Insufficient documentation

## 2021-09-20 DIAGNOSIS — L0291 Cutaneous abscess, unspecified: Secondary | ICD-10-CM

## 2021-09-20 DIAGNOSIS — Z91040 Latex allergy status: Secondary | ICD-10-CM | POA: Insufficient documentation

## 2021-09-20 MED ORDER — LIDOCAINE-EPINEPHRINE (PF) 2 %-1:200000 IJ SOLN
10.0000 mL | Freq: Once | INTRAMUSCULAR | Status: AC
  Administered 2021-09-20: 10 mL via INTRADERMAL
  Filled 2021-09-20: qty 20

## 2021-09-20 MED ORDER — BACITRACIN ZINC 500 UNIT/GM EX OINT
TOPICAL_OINTMENT | CUTANEOUS | Status: AC
  Filled 2021-09-20: qty 0.9

## 2021-09-20 NOTE — Discharge Instructions (Signed)
Return for rapid spreading redness if you develop a fever.  Warm compresses at least 4 times a day.
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
## 2021-09-20 NOTE — ED Triage Notes (Signed)
Pt arrived via POV, c/o swelling and redness to site on left arm. States he squeezed it last night and clear fluid came out.

## 2022-06-05 ENCOUNTER — Ambulatory Visit
Admission: EM | Admit: 2022-06-05 | Discharge: 2022-06-05 | Disposition: A | Discharge status: Home / Self Care | Payer: Self-pay | Attending: Family Medicine | Admitting: Family Medicine

## 2022-06-05 DIAGNOSIS — L03811 Cellulitis of head [any part, except face]: Secondary | ICD-10-CM

## 2022-06-05 DIAGNOSIS — L0291 Cutaneous abscess, unspecified: Secondary | ICD-10-CM

## 2022-06-05 MED ORDER — SULFAMETHOXAZOLE-TRIMETHOPRIM 800-160 MG PO TABS: 1.0000 | ORAL_TABLET | Freq: Two times a day (BID) | ORAL | 0 refills | Status: DC

## 2022-06-05 NOTE — ED Triage Notes (Signed)
Pt c/o painful abscess to back of neck. States it has significantly gotten worse since last night.

## 2022-06-05 NOTE — Discharge Instructions (Addendum)
Instructed patient to take medication as directed with food to completion.  Encouraged increase daily water intake or ounces per day while taking this medication.  Advised if symptoms worsen and/or unresolved please follow-up with PCP or here for further evaluation.

## 2022-06-05 NOTE — ED Provider Notes (Signed)
[facility]    CSN: [id number] Arrival date & time: 06/05/22  0948      History   Chief Complaint Chief Complaint  Patient presents with   Abscess    HPI Chun Daney is a 32 y.o. male.   HPI 32 year old male presents with painful abscess to back of scalp for 4-5 days.  Reports worsened overnight.  PMH significant for polysubstance abuse and history of splenectomy.  Past Medical History:  Diagnosis Date   Polysubstance abuse (HCC)    opana, meth, xanax, heroin, adderal    There are no problems to display for this patient.   Past Surgical History:  Procedure Laterality Date   SPLENECTOMY, TOTAL         Home Medications    Prior to Admission medications   Medication Sig Start Date End Date Taking? Authorizing Provider  sulfamethoxazole-trimethoprim (BACTRIM DS) 800-160 MG tablet Take 1 tablet by mouth 2 (two) times daily for 10 days. 06/05/22 06/15/22 Yes Trevor Iha, FNP  naloxone [facility]) nasal spray 4 mg/0.1 mL Please spray Naloxone into one nostril during overdose and call 911. 10/29/20   Cristopher Peru, PA-C  oxyCODONE (ROXICODONE) 5 MG immediate release tablet Take 1 pill every 6 hours for pain if not resolved by Tylenol or Motrin alone 07/15/21   Bethann Berkshire, MD    Family History Family History  Problem Relation Age of Onset   Heart disease Father    Diabetes Father     Social History Social History   Tobacco Use   Smoking status: Former   Smokeless tobacco: Current    Types: Snuff  Vaping Use   Vaping Use: Every day  Substance Use Topics   Alcohol use: Yes    Alcohol/week: 13.0 standard drinks of alcohol    Types: 1 Standard drinks or equivalent, 12 Cans of beer per week   Drug use: No    Types: Methamphetamines    Comment: Rehab released 04/21/15 adderal, xanax, meth, opana, heroin     Allergies   Latex   Review of Systems Review of Systems  Skin:  Positive for rash.  All other systems reviewed and are  negative.    Physical Exam Triage Vital Signs ED Triage Vitals  Enc Vitals Group     BP 06/05/22 0959 128/77     Pulse Rate 06/05/22 0959 91     Resp 06/05/22 0959 17     Temp 06/05/22 0959 97.6 F (36.4 C)     Temp Source 06/05/22 0959 Oral     SpO2 06/05/22 0959 97 %     Weight --      Height --      Head Circumference --      Peak Flow --      Pain Score 06/05/22 1000 7     Pain Loc --      Pain Edu? --      Excl. in GC? --    No data found.  Updated Vital Signs BP 128/77 (BP Location: Right Arm)   Pulse 91   Temp 97.6 F (36.4 C) (Oral)   Resp 17   SpO2 97%     Physical Exam Vitals and nursing note reviewed.  Constitutional:      Appearance: Normal appearance. He is normal weight.  HENT:     Head: Normocephalic and atraumatic.     Mouth/Throat:     Mouth: Mucous membranes are moist.     Pharynx: Oropharynx is clear.  Eyes:     Extraocular Movements: Extraocular movements intact.     Conjunctiva/sclera: Conjunctivae normal.     Pupils: Pupils are equal, round, and reactive to light.  Cardiovascular:     Rate and Rhythm: Normal rate and regular rhythm.     Pulses: Normal pulses.     Heart sounds: Normal heart sounds.  Pulmonary:     Effort: Pulmonary effort is normal.     Breath sounds: Normal breath sounds. No wheezing, rhonchi or rales.  Musculoskeletal:        General: Normal range of motion.     Cervical back: Normal range of motion and neck supple. Tenderness present.  Lymphadenopathy:     Cervical: No cervical adenopathy.  Skin:    General: Skin is warm and dry.     Comments: Scalp (left-sided occipital parietal area): Mildly erythematous, nonindurated area noted-please see image below  Neurological:     General: No focal deficit present.     Mental Status: He is alert and oriented to person, place, and time. Mental status is at baseline.      UC Treatments / Results  Labs (all labs ordered are listed, but only abnormal results are  displayed) Labs Reviewed - No data to display  EKG   Radiology No results found.  Procedures Procedures (including critical care time)  Medications Ordered in UC Medications - No data to display  Initial Impression / Assessment and Plan / UC Course  I have reviewed the triage vital signs and the nursing notes.  Pertinent labs & imaging results that were available during my care of the patient were reviewed by me and considered in my medical decision making (see chart for details).    MDM: 1.  Abscess or cellulitis of scalp-Rx'd Bactrim DS 800/160 mg tablet twice daily x 10 days. Instructed patient to take medication as directed with food to completion.  Encouraged increase daily water intake or ounces per day while taking this medication.  Advised if symptoms worsen and/or unresolved please follow-up with PCP or here for further evaluation.  Final Clinical Impressions(s) / UC Diagnoses   Final diagnoses:  Abscess or cellulitis of scalp     Discharge Instructions      Instructed patient to take medication as directed with food to completion.  Encouraged increase daily water intake or ounces per day while taking this medication.  Advised if symptoms worsen and/or unresolved please follow-up with PCP or here for further evaluation.     ED Prescriptions     Medication Sig Dispense Auth. Provider   sulfamethoxazole-trimethoprim (BACTRIM DS) 800-160 MG tablet Take 1 tablet by mouth 2 (two) times daily for 10 days. 20 tablet Trevor Iha, FNP      PDMP not reviewed this encounter.   Trevor Iha, FNP 06/05/22 1047

## 2022-06-07 ENCOUNTER — Encounter (HOSPITAL_COMMUNITY): Payer: Self-pay

## 2022-06-07 ENCOUNTER — Emergency Department (HOSPITAL_COMMUNITY)
Admission: EM | Admit: 2022-06-07 | Discharge: 2022-06-07 | Disposition: A | Discharge status: Home / Self Care | Payer: Self-pay | Attending: Emergency Medicine | Admitting: Emergency Medicine

## 2022-06-07 DIAGNOSIS — L02811 Cutaneous abscess of head [any part, except face]: Secondary | ICD-10-CM | POA: Insufficient documentation

## 2022-06-07 MED ORDER — LIDOCAINE-EPINEPHRINE (PF) 2 %-1:200000 IJ SOLN
10.0000 mL | Freq: Once | INTRAMUSCULAR | Status: AC
  Administered 2022-06-07: 10 mL
  Filled 2022-06-07: qty 20

## 2022-06-07 NOTE — ED Triage Notes (Signed)
Pt arrived via POV, c/o abscess on neck. Was seen x2 days ago at [facility], started on abx. States that abscess is more red and painful. Denies any fevers at home.

## 2022-06-07 NOTE — Discharge Instructions (Addendum)
You were seen in the emergency department for an abscess.  We have drained the area and cleaned it. I would like you to have the wound checked in 2-3 days. This can be done by any doctor's office, urgent care, or emergency department. This is to make sure the area hasn't closed too soon. Try to keep the area as clean and dry as possible. It is okay to let warm soapy water run over the area, but do NOT scrub the area.   Usually warm compresses can help pockets of infection come to surface, however some of your pain may be related to how swollen it is, and using ice on the area could help with this.   It is important you finish the course of antibiotics you were given. You can take ibuprofen or tylenol as needed for pain.   Continue to monitor how you're doing and return to the ER for new or worsening symptoms.

## 2022-06-07 NOTE — ED Provider Notes (Signed)
[place] EMERGENCY DEPARTMENT AT [facility] Provider Note   CSN: [id number] Arrival date & time: 06/07/22  1258     History  Chief Complaint  Patient presents with   Abscess    Shane Carr is a 32 y.o. male with history of polysubstance abuse who presents to the ER complaining of scalp abscess. Has been present for past week, getting larger and more painful. History of same on his arm requiring incision and drainage. No fevers. Went to UC on 6/4 and given antibiotics (Bactrim), but has greatly increased in size since.   HPI     Home Medications Prior to Admission medications   Medication Sig Start Date End Date Taking? Authorizing Provider  naloxone [facility]) nasal spray 4 mg/0.1 mL Please spray Naloxone into one nostril during overdose and call 911. 10/29/20   Cristopher Peru, PA-C  oxyCODONE (ROXICODONE) 5 MG immediate release tablet Take 1 pill every 6 hours for pain if not resolved by Tylenol or Motrin alone 07/15/21   Bethann Berkshire, MD  sulfamethoxazole-trimethoprim (BACTRIM DS) 800-160 MG tablet Take 1 tablet by mouth 2 (two) times daily for 10 days. 06/05/22 06/15/22  Trevor Iha, FNP      Allergies    Latex    Review of Systems   Review of Systems  Skin:        abscess  All other systems reviewed and are negative.   Physical Exam Updated Vital Signs BP (!) 137/93   Pulse 84   Temp 98.3 F (36.8 C) (Oral)   Resp 15   SpO2 100%  Physical Exam Vitals and nursing note reviewed.  Constitutional:      Appearance: Normal appearance.  HENT:     Head: Normocephalic and atraumatic.      Comments: Area of erythema about 6 cm, induration and fluctuance about 3 cm Eyes:     Conjunctiva/sclera: Conjunctivae normal.  Pulmonary:     Effort: Pulmonary effort is normal. No respiratory distress.  Skin:    General: Skin is warm and dry.  Neurological:     Mental Status: He is alert.  Psychiatric:        Mood and Affect: Mood normal.         Behavior: Behavior normal.     ED Results / Procedures / Treatments   Labs (all labs ordered are listed, but only abnormal results are displayed) Labs Reviewed - No data to display  EKG None  Radiology No results found.  Procedures .Marland KitchenIncision and Drainage  Date/Time: 06/07/2022 2:56 PM  Performed by: Su Monks, PA-C Authorized by: Su Monks, PA-C   Consent:    Consent obtained:  Verbal   Consent given by:  Patient   Risks discussed:  Incomplete drainage, pain and bleeding Universal protocol:    Patient identity confirmed:  Provided demographic data Location:    Type:  Abscess   Size:  3 cm   Location:  Head   Head location:  Scalp Pre-procedure details:    Skin preparation:  Chlorhexidine with alcohol and povidone-iodine Sedation:    Sedation type:  None Anesthesia:    Anesthesia method:  Local infiltration   Local anesthetic:  Lidocaine 2% WITH epi Procedure type:    Complexity:  Simple Procedure details:    Ultrasound guidance: yes     Incision types:  Stab incision   Wound management:  Probed and deloculated   Drainage:  Bloody and purulent   Drainage amount:  Scant  Wound treatment:  Wound left open   Packing materials:  None Post-procedure details:    Procedure completion:  Tolerated well, no immediate complications     Medications Ordered in ED Medications  lidocaine-EPINEPHrine (XYLOCAINE W/EPI) 2 %-1:200000 (PF) injection 10 mL (10 mLs Infiltration Given by Other 06/07/22 1352)    ED Course/ Medical Decision Making/ A&P                             Medical Decision Making Risk Prescription drug management.   Patient is a 32 y.o. male who presents to the emergency department for an abscess.  Physical exam: Area of erythema about 6 cm, induration and fluctuance about 3 cm on the posterior/occiput scalp  Procedure: Abscess was incised and drained. Area was anesthestized with lidocaine with epinephrine. Patient tolerated  procedure well with no immediate complications. Only scant amount of pus and blood drained.   Disposition: Patient is not requiring admission or inpatient treatment further symptoms.  Patient was placed on course of antibiotics and instructed to have a wound check in 3 days.  We discussed reasons to return to the emergency department, and patient is agreeable to the plan.  Final Clinical Impression(s) / ED Diagnoses Final diagnoses:  Scalp abscess    Rx / DC Orders ED Discharge Orders     None      Portions of this report may have been transcribed using voice recognition software. Every effort was made to ensure accuracy; however, inadvertent computerized transcription errors may be present.    Jeanella Flattery 06/07/22 1457    Cathren Laine, MD 06/09/22 [phone]

## 2022-06-11 ENCOUNTER — Emergency Department (HOSPITAL_BASED_OUTPATIENT_CLINIC_OR_DEPARTMENT_OTHER)
Admission: EM | Admit: 2022-06-11 | Discharge: 2022-06-11 | Disposition: A | Discharge status: Home / Self Care | Payer: Self-pay | Attending: Emergency Medicine | Admitting: Emergency Medicine

## 2022-06-11 ENCOUNTER — Other Ambulatory Visit: Payer: Self-pay

## 2022-06-11 DIAGNOSIS — L02811 Cutaneous abscess of head [any part, except face]: Secondary | ICD-10-CM | POA: Insufficient documentation

## 2022-06-11 DIAGNOSIS — L0390 Cellulitis, unspecified: Secondary | ICD-10-CM

## 2022-06-11 DIAGNOSIS — Z5189 Encounter for other specified aftercare: Secondary | ICD-10-CM | POA: Insufficient documentation

## 2022-06-11 MED ORDER — DOXYCYCLINE HYCLATE 100 MG PO CAPS: 100.0000 mg | ORAL_CAPSULE | Freq: Two times a day (BID) | ORAL | 0 refills | Status: AC

## 2022-06-11 MED ORDER — CEPHALEXIN 500 MG PO CAPS: 500.0000 mg | ORAL_CAPSULE | Freq: Three times a day (TID) | ORAL | 0 refills | Status: AC

## 2022-06-11 MED ORDER — CEPHALEXIN 500 MG PO CAPS: 500.0000 mg | ORAL_CAPSULE | Freq: Three times a day (TID) | ORAL | 0 refills | Status: DC

## 2022-06-11 MED ORDER — DOXYCYCLINE HYCLATE 100 MG PO CAPS: 100.0000 mg | ORAL_CAPSULE | Freq: Two times a day (BID) | ORAL | 0 refills | Status: DC

## 2022-06-11 NOTE — ED Provider Notes (Signed)
[place] EMERGENCY DEPARTMENT AT [facility] Provider Note   CSN: [id number] Arrival date & time: 06/11/22  2012     History Chief Complaint  Patient presents with   Abscess    HPI Shane Carr is a 32 y.o. male presenting for chief complaint of skin abscess.  Patient denied history of drug use, chart review reveals history of overdose. Was seen 2 weeks ago by PCP started on Bactrim for scalp abscess.  Extensive history of similar. Denies fevers chills nausea vomiting shortness of breath.  Wounds have been draining and are slowly improving, however he finishes antibiotic in the morning and is worried about the continued presence of some discomfort and cellulitis..   Patient's recorded medical, surgical, social, medication list and allergies were reviewed in the Snapshot window as part of the initial history.   Review of Systems   Review of Systems  Constitutional:  Positive for fever. Negative for chills.  HENT:  Negative for ear pain and sore throat.   Eyes:  Negative for pain and visual disturbance.  Respiratory:  Negative for cough and shortness of breath.   Cardiovascular:  Negative for chest pain and palpitations.  Gastrointestinal:  Negative for abdominal pain and vomiting.  Genitourinary:  Negative for dysuria and hematuria.  Musculoskeletal:  Negative for arthralgias and back pain.  Skin:  Positive for wound. Negative for color change and rash.  Neurological:  Negative for seizures and syncope.  All other systems reviewed and are negative.   Physical Exam Updated Vital Signs BP (!) 148/94   Pulse 90   Temp 98.2 F (36.8 C) (Oral)   Resp 18   Ht 5\' 11"  (1.803 m)   Wt 72.6 kg   SpO2 99%   BMI 22.32 kg/m  Physical Exam Vitals and nursing note reviewed.  Constitutional:      General: He is not in acute distress.    Appearance: He is well-developed.  HENT:     Head: Normocephalic and atraumatic.  Eyes:     Conjunctiva/sclera: Conjunctivae  normal.  Cardiovascular:     Rate and Rhythm: Normal rate and regular rhythm.     Heart sounds: No murmur heard. Pulmonary:     Effort: Pulmonary effort is normal. No respiratory distress.     Breath sounds: Normal breath sounds.  Abdominal:     Palpations: Abdomen is soft.     Tenderness: There is no abdominal tenderness.  Musculoskeletal:        General: Deformity (Obvious deformity on the posterior scalp with draining wound.) present. No swelling.     Cervical back: Neck supple.  Skin:    General: Skin is warm and dry.     Capillary Refill: Capillary refill takes less than 2 seconds.  Neurological:     Mental Status: He is alert.  Psychiatric:        Mood and Affect: Mood normal.      ED Course/ Medical Decision Making/ A&P    Procedures Ultrasound ED Soft Tissue  Date/Time: 06/11/2022 9:23 PM  Performed by: Glyn Ade, MD Authorized by: Glyn Ade, MD   Procedure details:    Indications: localization of abscess     Transverse view:  Visualized   Longitudinal view:  Visualized   Images: archived     Limitations:  Body habitus Location:    Location: head     Side:  Midline Findings:     no abscess present    cellulitis present    Medications Ordered in  ED Medications - No data to display  Medical Decision Making:   Patient presenting with skin abscess.  Point-of-care ultrasound was performed demonstrating no residual fluid.  However he still has overlying cellulitis despite completion of antibiotic course.  Will broaden him to staph and strep coverage with doxycycline and Keflex respectively.  Hemodynamically stable acute distress.  No evidence of acute systemic infection such as sepsis.  Disposition:  I have considered need for hospitalization, however, considering all of the above, I believe this patient is stable for discharge at this time.  Patient/family educated about specific return precautions for given chief complaint and symptoms.   Patient/family educated about follow-up with PCP.     Patient/family expressed understanding of return precautions and need for follow-up. Patient spoken to regarding all imaging and laboratory results and appropriate follow up for these results. All education provided in verbal form with additional information in written form. Time was allowed for answering of patient questions. Patient discharged.    Emergency Department Medication Summary:   Medications - No data to display     Clinical Impression:  1. Encounter for wound care   2. Cellulitis, unspecified cellulitis site      Discharge   Final Clinical Impression(s) / ED Diagnoses Final diagnoses:  Encounter for wound care  Cellulitis, unspecified cellulitis site    Rx / DC Orders ED Discharge Orders          Ordered    cephALEXin (KEFLEX) 500 MG capsule  3 times daily        06/11/22 2124    doxycycline (VIBRAMYCIN) 100 MG capsule  2 times daily        06/11/22 2124              Glyn Ade, MD 06/11/22 2124

## 2022-06-11 NOTE — ED Triage Notes (Signed)
Patient here POV from Home.  Endorses Abscess to Posterior neck for 2 Weeks. Seen a few days ago in ED. I&D completed and Antibiotics given. Abscess has since improved but seeks Evaluation as he is almost done with Antibiotics.   No Known Fevers. Some White/Yellow Drainage.   NAD Noted during Triage. A&Ox4. GCS 15. Ambulatory.

## 2022-07-21 ENCOUNTER — Other Ambulatory Visit: Payer: Self-pay

## 2022-07-21 ENCOUNTER — Encounter (HOSPITAL_COMMUNITY): Payer: Self-pay | Admitting: Emergency Medicine

## 2022-07-21 ENCOUNTER — Emergency Department (HOSPITAL_COMMUNITY): Payer: Self-pay

## 2022-07-21 ENCOUNTER — Emergency Department (HOSPITAL_COMMUNITY): Admission: EM | Admit: 2022-07-21 | Discharge: 2022-07-21 | Payer: Self-pay | Attending: Student | Admitting: Student

## 2022-07-21 DIAGNOSIS — X58XXXA Exposure to other specified factors, initial encounter: Secondary | ICD-10-CM | POA: Insufficient documentation

## 2022-07-21 DIAGNOSIS — S0001XA Abrasion of scalp, initial encounter: Secondary | ICD-10-CM | POA: Insufficient documentation

## 2022-07-21 DIAGNOSIS — S0990XA Unspecified injury of head, initial encounter: Secondary | ICD-10-CM

## 2022-07-21 MED ORDER — NAPROXEN 250 MG PO TABS
500.0000 mg | ORAL_TABLET | Freq: Once | ORAL | Status: AC
  Administered 2022-07-21: 500 mg via ORAL
  Filled 2022-07-21: qty 2
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
## 2024-01-08 ENCOUNTER — Ambulatory Visit: Payer: Self-pay

## 2024-01-08 NOTE — Telephone Encounter (Signed)
 Patient triaged in previous encounter. See documentation,  Message from Harlene ORN sent at 01/08/2024 11:25 AM EST  Reason for Triage: Mother of patient called. Wanting to establish care for her son who is released from incarceration. He has shakes and she believes he has PTSD; has an extensive drug history and is not sure if that is contributing to the body shakes.  Phone: [phone], ask for melanie Harvel (DPR approved)

## 2024-01-08 NOTE — Telephone Encounter (Signed)
 FYI Only or Action Required?: FYI only for provider: appointment scheduled on 01/14/24.  Patient was last seen in primary care on 10/19/15.  Called Nurse Triage reporting Shaking.  Symptoms began several days ago.  Interventions attempted: Nothing.  Symptoms are: unchanged.  Triage Disposition: See PCP Within 2 Weeks (overriding See HCP Within 4 Hours (Or PCP Triage))  Patient/caregiver understands and will follow disposition?: Yes  Reason for Disposition  [1] New-onset muscle jerks AND [2] unexplained AND [3] 3 or more times per day  Answer Assessment - Initial Assessment Questions Spoke with patient's mother, Andrea, who states patient was released from incarceration on Sunday and since this time she and his wife have noticed this shaking in his hands and legs when sitting down. When he is up moving around it subsides. She is concerned about anxiety and PTSD and would like for him to be able to talk to someone. Office visit scheduled. Advised to call back if symptoms worsen.  1. APPEARANCE of MOVEMENT: What did the jerking or twitching look like? (e.g., body area)     Legs and hands-shaking/restlessness  2. ONSET: When did this start happening? (e.g., hours, days, weeks, months ago)     Sunday  3. DURATION: How long does the jerk, twitch, or spasm last?     More than a few minutes  4. FREQUENCY:  How often does this happen?      Seems frequent  5. WHEN: When does this happen? (e.g., while awake, while falling asleep, while sleeping)     While awake and sitting  6. CAUSE: What do you think caused the symptoms?     Anxiety, PTSD  7. OTHER SYMPTOMS: Are there any other symptoms? (e.g., fever, headache)     No  8. PREGNANCY: Is there any chance you are pregnant? When was your last menstrual period?     NA  Protocols used: Muscle Jerks - Tics - [facility]  Copied from CRM #[id number]. Topic: Clinical - Red Word Triage >> Jan 08, 2024 11:26 AM Harlene ORN  wrote: Red Word that prompted transfer to Nurse Triage: Mother of patient called. Wanting to establish care for her son who is released from incarceration. He has shakes and she believes he has PTSD; has an extensive drug history and is not sure if that is contributing to the body shakes.

## 2024-01-14 ENCOUNTER — Encounter: Payer: Self-pay | Admitting: Family Medicine

## 2024-01-14 ENCOUNTER — Ambulatory Visit: Payer: Self-pay | Admitting: Family Medicine

## 2024-01-14 VITALS — BP 123/76 | HR 89 | Temp 98.2°F | Ht 71.0 in | Wt 191.6 lb

## 2024-01-14 DIAGNOSIS — F411 Generalized anxiety disorder: Secondary | ICD-10-CM

## 2024-01-14 DIAGNOSIS — Z789 Other specified health status: Secondary | ICD-10-CM

## 2024-01-14 DIAGNOSIS — F1991 Other psychoactive substance use, unspecified, in remission: Secondary | ICD-10-CM

## 2024-01-14 MED ORDER — HYDROXYZINE PAMOATE 50 MG PO CAPS: 50.0000 mg | ORAL_CAPSULE | Freq: Three times a day (TID) | ORAL | 0 refills | Status: AC | PRN

## 2024-01-14 MED ORDER — SERTRALINE HCL 25 MG PO TABS: 25.0000 mg | ORAL_TABLET | Freq: Every day | ORAL | 0 refills | Status: DC

## 2024-01-14 NOTE — Progress Notes (Signed)
 "  New Patient Office Visit  Patient ID: Shane Carr, Male   DOB: 1990/04/26 33 y.o. MRN: [id number]  Chief Complaint  Patient presents with   Establish Care   Anxiety    Patient states that he got out of prison about 2 weeks ago and since then he has been having a lot of anxiety and feels like he needs help so that he doesn't go back down the wrong path again.    Subjective:     Shane Carr presents to establish care  Anxiety      Discussed the use of AI scribe software for clinical note transcription with the patient, who gave verbal consent to proceed.  History of Present Illness   Shane Carr is a 34 year old male who presents with anxiety.    Anxiety and substance use hx - Experiences anxiety and fears of relapsing into drug use, particularly heroin, following release from prison two weeks ago - History of substance use, including heroin several years ago.  - Reports recently being in jail for 2 years. Reports being in and out of jail since age 75 yo.  - Pattern of relapse after previous releases from incarceration - Not currently using illegal drugs - Required to attend AA type classes.  - Currently on parole with a monitoring bracelet due to high-risk status - Requesting possible referral to substance use clinic.   General medical history - No history of diabetes, hypertension, or hyperlipidemia - No diagnosis of bipolar disorder per patient.  - No prior use of SSRIs - Father has used Zoloft  with success  Social and functional status - Starting work at a therapist, art.       Outpatient Encounter Medications as of 01/14/2024  Medication Sig   hydrOXYzine  (VISTARIL ) 50 MG capsule Take 1 capsule (50 mg total) by mouth 3 (three) times daily as needed.   sertraline  (ZOLOFT ) 25 MG tablet Take 1 tablet (25 mg total) by mouth daily.   [DISCONTINUED] naloxone  (NARCAN ) nasal spray 4 mg/0.1 mL Please spray Naloxone  into one nostril during  overdose and call 911.   [DISCONTINUED] oxyCODONE  (ROXICODONE ) 5 MG immediate release tablet Take 1 pill every 6 hours for pain if not resolved by Tylenol  or Motrin alone   No facility-administered encounter medications on file as of 01/14/2024.    Past Medical History:  Diagnosis Date   Polysubstance abuse (HCC)    opana, meth, xanax, heroin, adderal    Past Surgical History:  Procedure Laterality Date   SPLENECTOMY, TOTAL      Family History  Problem Relation Age of Onset   Heart disease Father    Diabetes Father     Social History   Socioeconomic History   Marital status: Single    Spouse name: Not on file   Number of children: Not on file   Years of education: Not on file   Highest education level: Not on file  Occupational History   Not on file  Tobacco Use   Smoking status: Former    Passive exposure: Past   Smokeless tobacco: Former    Types: Snuff  Vaping Use   Vaping status: Former  Substance and Sexual Activity   Alcohol use: Not Currently   Drug use: No    Types: Methamphetamines    Comment: Rehab released 04/21/15 adderal, xanax, meth, opana, heroin   Sexual activity: Yes    Birth control/protection: None  Other Topics Concern   Not on file  Social History  Narrative   Not on file   Social Drivers of Health   Tobacco Use: Medium Risk (01/14/2024)   Patient History    Smoking Tobacco Use: Former    Smokeless Tobacco Use: Former    Passive Exposure: Past  Programmer, Applications: Not on [employer] Insecurity: Not on file  Transportation Needs: Not on file  Physical Activity: Not on file  Stress: Not on file  Social Connections: Not on file  Intimate Partner Violence: Not on file  Depression (PHQ2-9): High Risk (01/14/2024)   Depression (PHQ2-9)    PHQ-2 Score: 14  Alcohol Screen: Not on file  Housing: Not on file  Utilities: Not on file  Health Literacy: Not on file    ROS    Objective:    BP 123/76 (Cuff Size: Large)   Pulse 89    Temp 98.2 F (36.8 C)   Ht 5' 11 (1.803 m)   Wt 191 lb 9.6 oz (86.9 kg)   SpO2 99%   BMI 26.72 kg/m   Physical Exam Vitals reviewed.  Constitutional:      Appearance: Normal appearance.  HENT:     Head: Normocephalic and atraumatic.  Eyes:     Extraocular Movements: Extraocular movements intact.     Conjunctiva/sclera: Conjunctivae normal.     Pupils: Pupils are equal, round, and reactive to light.  Cardiovascular:     Rate and Rhythm: Normal rate and regular rhythm.     Pulses: Normal pulses.     Heart sounds: Normal heart sounds. No murmur heard. Pulmonary:     Effort: Pulmonary effort is normal. No respiratory distress.     Breath sounds: Normal breath sounds.  Musculoskeletal:        General: No swelling or deformity. Normal range of motion.     Cervical back: Normal range of motion and neck supple.  Skin:    General: Skin is warm and dry.  Neurological:     General: No focal deficit present.     Mental Status: He is alert and oriented to person, place, and time.  Psychiatric:        Mood and Affect: Mood normal.        Behavior: Behavior normal.          Assessment & Plan:   Problem List Items Addressed This Visit   None Visit Diagnoses       GAD (generalized anxiety disorder)    -  Primary   Relevant Medications   sertraline  (ZOLOFT ) 25 MG tablet   hydrOXYzine  (VISTARIL ) 50 MG capsule     History of incarceration         History of drug use           Assessment and Plan    Generalized anxiety disorder Exacerbated by recent release from incarceration. - Explained the slow onset of SSRIs, need for dose titration, non-addictive nature, potential side effects including black box warning for suicidal thoughts. - Started Zoloft  at 25 mg/day.  - Vistaril  50 mg TID prn for anxiety.  - Scheduled follow-up in two weeks. - F/u sooner for any side effects or concerns.   Opioid dependence, in remission - Emphasized addressing anxiety to help prevent  relapse. - Provided patient with local addiction specialist clinics information.        Return in about 2 weeks (around 01/28/2024).   Oneil LELON Severin, FNP [facility]     "

## 2024-01-28 ENCOUNTER — Ambulatory Visit (INDEPENDENT_AMBULATORY_CARE_PROVIDER_SITE_OTHER): Payer: Self-pay | Admitting: Family Medicine

## 2024-01-28 ENCOUNTER — Encounter: Payer: Self-pay | Admitting: Family Medicine

## 2024-01-28 VITALS — BP 113/68 | HR 85 | Temp 98.6°F | Ht 71.0 in | Wt 199.8 lb

## 2024-01-28 DIAGNOSIS — F411 Generalized anxiety disorder: Secondary | ICD-10-CM

## 2024-01-28 DIAGNOSIS — G4700 Insomnia, unspecified: Secondary | ICD-10-CM

## 2024-01-28 MED ORDER — TRAZODONE HCL 50 MG PO TABS: 25.0000 mg | ORAL_TABLET | Freq: Every evening | ORAL | 0 refills | Status: DC | PRN

## 2024-01-28 MED ORDER — RAMELTEON 8 MG PO TABS: 8.0000 mg | ORAL_TABLET | Freq: Every day | ORAL | 0 refills | Status: AC

## 2024-01-28 MED ORDER — SERTRALINE HCL 100 MG PO TABS: 100.0000 mg | ORAL_TABLET | Freq: Every day | ORAL | 3 refills | Status: AC

## 2024-01-28 NOTE — Progress Notes (Signed)
 "  Established Patient Office Visit  Patient ID: Shane Carr, male    DOB: 1990-03-22  Age: 34 y.o. MRN: [id number] PCP: Alcus Oneil ORN, FNP  Chief Complaint  Patient presents with   Medical Management of Chronic Issues    2 week follow up anxiety medications     Subjective:     HPI  Discussed the use of AI scribe software for clinical note transcription with the patient, who gave verbal consent to proceed.  History of Present Illness   2 week f/u for anxiety.   Patient was started on Zoloft  25 mg/day on 01/14/2024 for anxiety.  He was also prescribed Vistaril  as needed at that time for anxiety.  The patient reports today that he did not see any improvement on the Zoloft  and increase the dosage to 50 mg/day on his own shortly after starting the medication.  Patient reports that he still having problems getting and falling asleep.       ROS    Objective:     BP 113/68   Pulse 85   Temp 98.6 F (37 C)   Ht 5' 11 (1.803 m)   Wt 199 lb 12.8 oz (90.6 kg)   SpO2 97%   BMI 27.87 kg/m    Physical Exam Vitals reviewed.  Constitutional:      Appearance: Normal appearance.  HENT:     Head: Normocephalic and atraumatic.  Eyes:     Extraocular Movements: Extraocular movements intact.     Conjunctiva/sclera: Conjunctivae normal.     Pupils: Pupils are equal, round, and reactive to light.  Cardiovascular:     Rate and Rhythm: Normal rate and regular rhythm.     Pulses: Normal pulses.     Heart sounds: Normal heart sounds. No murmur heard. Pulmonary:     Effort: Pulmonary effort is normal. No respiratory distress.     Breath sounds: Normal breath sounds.  Musculoskeletal:        General: No deformity. Normal range of motion.     Cervical back: Normal range of motion.  Skin:    General: Skin is warm and dry.  Neurological:     General: No focal deficit present.     Mental Status: He is alert and oriented to person, place, and time.  Psychiatric:         Mood and Affect: Mood normal.        Behavior: Behavior normal.      No results found for any visits on 01/28/24.    The ASCVD Risk score (Arnett DK, et al., 2019) failed to calculate for the following reasons:   The 2019 ASCVD risk score is only valid for ages 76 to 13   * - Cholesterol units were assumed    Assessment & Plan:   Problem List Items Addressed This Visit   None Visit Diagnoses       GAD (generalized anxiety disorder)    -  Primary   Relevant Medications   sertraline  (ZOLOFT ) 100 MG tablet   Other Relevant Orders   Ambulatory referral to Psychiatry     Insomnia, unspecified type       Relevant Medications   ramelteon  (ROZEREM ) 8 MG tablet       Assessment and Plan Assessment & Plan  Anxiety - Increase Zoloft  to 100 mg/day.   - Recommended against the patient increasing medication on his own - Follow-up in 2 weeks for reevaluation or sooner for any concerns. - Placed referral to  psychiatry for further management.  Patient was requiring Thorazine while in prison he states.  Discussed psychiatry referral option with the patient and he was agreeable.   Insomnia - Ordered ramelteon  to trial. - Follow-up for any concerns or side effects.   Return in about 2 weeks (around 02/11/2024).    Oneil LELON Severin, FNP [facility]   "

## 2024-02-13 ENCOUNTER — Ambulatory Visit: Payer: Self-pay | Admitting: Family Medicine
